# Patient Record
Sex: Female | Born: 1965 | Race: Asian | Hispanic: No | Marital: Married | Smoking: Never smoker
Health system: Southern US, Community
[De-identification: ages and names within clinical notes are randomized; demographics above are authoritative.]

## PROBLEM LIST (undated history)

## (undated) DIAGNOSIS — G5600 Carpal tunnel syndrome, unspecified upper limb: Secondary | ICD-10-CM

## (undated) DIAGNOSIS — N200 Calculus of kidney: Secondary | ICD-10-CM

## (undated) DIAGNOSIS — Z87442 Personal history of urinary calculi: Secondary | ICD-10-CM

## (undated) DIAGNOSIS — S62109A Fracture of unspecified carpal bone, unspecified wrist, initial encounter for closed fracture: Secondary | ICD-10-CM

## (undated) DIAGNOSIS — R740 Nonspecific elevation of levels of transaminase and lactic acid dehydrogenase [LDH]: Secondary | ICD-10-CM

## (undated) DIAGNOSIS — D219 Benign neoplasm of connective and other soft tissue, unspecified: Secondary | ICD-10-CM

## (undated) DIAGNOSIS — E049 Nontoxic goiter, unspecified: Secondary | ICD-10-CM

## (undated) DIAGNOSIS — E041 Nontoxic single thyroid nodule: Secondary | ICD-10-CM

## (undated) DIAGNOSIS — N63 Unspecified lump in unspecified breast: Secondary | ICD-10-CM

## (undated) HISTORY — DX: Carpal tunnel syndrome, unspecified upper limb: G56.00

## (undated) HISTORY — DX: Calculus of kidney: N20.0

## (undated) HISTORY — DX: Fracture of unspecified carpal bone, unspecified wrist, initial encounter for closed fracture: S62.109A

## (undated) HISTORY — DX: Unspecified lump in unspecified breast: N63.0

## (undated) HISTORY — DX: Nonspecific elevation of levels of transaminase and lactic acid dehydrogenase (ldh): R74.0

## (undated) HISTORY — DX: Benign neoplasm of connective and other soft tissue, unspecified: D21.9

## (undated) HISTORY — PX: TUBAL LIGATION: SHX77

## (undated) HISTORY — DX: Nontoxic single thyroid nodule: E04.1

## (undated) HISTORY — DX: Nontoxic goiter, unspecified: E04.9

## (undated) HISTORY — DX: Personal history of urinary calculi: Z87.442

---

## 1999-09-11 ENCOUNTER — Emergency Department (HOSPITAL_COMMUNITY): Admission: EM | Admit: 1999-09-11 | Discharge: 1999-09-11 | Payer: Self-pay | Admitting: Emergency Medicine

## 1999-09-11 ENCOUNTER — Encounter: Payer: Self-pay | Admitting: Emergency Medicine

## 1999-09-11 ENCOUNTER — Ambulatory Visit (HOSPITAL_COMMUNITY): Admission: RE | Admit: 1999-09-11 | Discharge: 1999-09-11 | Payer: Self-pay | Admitting: Emergency Medicine

## 2003-11-06 ENCOUNTER — Other Ambulatory Visit: Admission: RE | Admit: 2003-11-06 | Discharge: 2003-11-06 | Payer: Self-pay | Admitting: Obstetrics and Gynecology

## 2003-11-07 ENCOUNTER — Encounter: Admission: RE | Admit: 2003-11-07 | Discharge: 2003-11-07 | Payer: Self-pay | Admitting: Obstetrics and Gynecology

## 2004-11-05 ENCOUNTER — Other Ambulatory Visit: Admission: RE | Admit: 2004-11-05 | Discharge: 2004-11-05 | Payer: Self-pay | Admitting: Obstetrics and Gynecology

## 2005-11-06 ENCOUNTER — Other Ambulatory Visit: Admission: RE | Admit: 2005-11-06 | Discharge: 2005-11-06 | Payer: Self-pay | Admitting: Obstetrics and Gynecology

## 2006-09-28 ENCOUNTER — Encounter: Admission: RE | Admit: 2006-09-28 | Discharge: 2006-09-28 | Payer: Self-pay | Admitting: Obstetrics and Gynecology

## 2008-11-26 LAB — CONVERTED CEMR LAB: Pap Smear: NORMAL

## 2008-12-28 ENCOUNTER — Encounter: Admission: RE | Admit: 2008-12-28 | Discharge: 2008-12-28 | Payer: Self-pay | Admitting: Obstetrics and Gynecology

## 2009-06-27 ENCOUNTER — Ambulatory Visit: Payer: Self-pay | Admitting: Internal Medicine

## 2009-06-27 DIAGNOSIS — Z87442 Personal history of urinary calculi: Secondary | ICD-10-CM

## 2009-06-27 HISTORY — DX: Personal history of urinary calculi: Z87.442

## 2009-06-27 LAB — CONVERTED CEMR LAB
ALT: 39 units/L — ABNORMAL HIGH (ref 0–35)
AST: 30 units/L (ref 0–37)
Albumin: 4.1 g/dL (ref 3.5–5.2)
Alkaline Phosphatase: 69 units/L (ref 39–117)
BUN: 10 mg/dL (ref 6–23)
Basophils Absolute: 0.1 10*3/uL (ref 0.0–0.1)
Basophils Relative: 1.4 % (ref 0.0–3.0)
Bilirubin, Direct: 0.1 mg/dL (ref 0.0–0.3)
CO2: 30 meq/L (ref 19–32)
Calcium: 8.7 mg/dL (ref 8.4–10.5)
Chloride: 102 meq/L (ref 96–112)
Creatinine, Ser: 0.6 mg/dL (ref 0.4–1.2)
Eosinophils Absolute: 0 10*3/uL (ref 0.0–0.7)
Eosinophils Relative: 0.5 % (ref 0.0–5.0)
Free T4: 0.7 ng/dL (ref 0.6–1.6)
GFR calc non Af Amer: 115.86 mL/min (ref >60)
Glucose, Bld: 91 mg/dL (ref 70–99)
HCT: 40.2 % (ref 36.0–46.0)
Hemoglobin: 13.4 g/dL (ref 12.0–15.0)
Lymphocytes Relative: 26.5 % (ref 12.0–46.0)
Lymphs Abs: 2.1 10*3/uL (ref 0.7–4.0)
MCHC: 33.2 g/dL (ref 30.0–36.0)
MCV: 87.8 fL (ref 78.0–100.0)
Monocytes Absolute: 0.5 10*3/uL (ref 0.1–1.0)
Monocytes Relative: 5.9 % (ref 3.0–12.0)
Neutro Abs: 5.1 10*3/uL (ref 1.4–7.7)
Neutrophils Relative %: 65.7 % (ref 43.0–77.0)
Platelets: 329 10*3/uL (ref 150.0–400.0)
Potassium: 3.8 meq/L (ref 3.5–5.1)
RBC: 4.58 M/uL (ref 3.87–5.11)
RDW: 11.7 % (ref 11.5–14.6)
Sed Rate: 21 mm/hr (ref 0–22)
Sodium: 138 meq/L (ref 135–145)
T3 Uptake Ratio: 34.3 % (ref 22.5–37.0)
T3, Free: 2.8 pg/mL (ref 2.3–4.2)
T4, Total: 6.7 ug/dL (ref 5.0–12.5)
TSH: 0.49 microintl units/mL (ref 0.35–5.50)
Total Bilirubin: 1.1 mg/dL (ref 0.3–1.2)
Total Protein: 7.4 g/dL (ref 6.0–8.3)
WBC: 7.8 10*3/uL (ref 4.5–10.5)

## 2009-07-01 ENCOUNTER — Ambulatory Visit (HOSPITAL_COMMUNITY): Admission: RE | Admit: 2009-07-01 | Discharge: 2009-07-01 | Payer: Self-pay | Admitting: Internal Medicine

## 2009-07-01 ENCOUNTER — Encounter: Payer: Self-pay | Admitting: Internal Medicine

## 2009-07-11 ENCOUNTER — Ambulatory Visit: Payer: Self-pay | Admitting: Internal Medicine

## 2009-07-11 DIAGNOSIS — R7401 Elevation of levels of liver transaminase levels: Secondary | ICD-10-CM

## 2009-07-11 DIAGNOSIS — R7402 Elevation of levels of lactic acid dehydrogenase (LDH): Secondary | ICD-10-CM

## 2009-07-11 HISTORY — DX: Elevation of levels of lactic acid dehydrogenase (LDH): R74.02

## 2009-07-11 HISTORY — DX: Elevation of levels of liver transaminase levels: R74.01

## 2009-07-11 LAB — CONVERTED CEMR LAB
ALT: 18 units/L (ref 0–35)
AST: 22 units/L (ref 0–37)
Albumin: 4.4 g/dL (ref 3.5–5.2)
Alkaline Phosphatase: 67 units/L (ref 39–117)
Bilirubin, Direct: 0.1 mg/dL (ref 0.0–0.3)
HCV Ab: NEGATIVE
Hep A Total Ab: POSITIVE — AB
Hep B Core Total Ab: POSITIVE — AB
Hep B S Ab: POSITIVE — AB
Hepatitis B Surface Ag: NEGATIVE
Total Bilirubin: 1.3 mg/dL — ABNORMAL HIGH (ref 0.3–1.2)
Total Protein: 8 g/dL (ref 6.0–8.3)

## 2009-08-01 ENCOUNTER — Encounter: Payer: Self-pay | Admitting: Endocrinology

## 2009-08-01 ENCOUNTER — Ambulatory Visit: Payer: Self-pay | Admitting: Endocrinology

## 2009-08-01 ENCOUNTER — Other Ambulatory Visit: Admission: RE | Admit: 2009-08-01 | Discharge: 2009-08-01 | Payer: Self-pay | Admitting: Endocrinology

## 2009-08-01 DIAGNOSIS — E041 Nontoxic single thyroid nodule: Secondary | ICD-10-CM

## 2009-08-01 HISTORY — DX: Nontoxic single thyroid nodule: E04.1

## 2009-12-30 ENCOUNTER — Encounter: Admission: RE | Admit: 2009-12-30 | Discharge: 2009-12-30 | Payer: Self-pay | Admitting: Obstetrics and Gynecology

## 2010-01-30 ENCOUNTER — Ambulatory Visit: Payer: Self-pay | Admitting: Endocrinology

## 2010-01-30 LAB — CONVERTED CEMR LAB
ALT: 31 units/L (ref 0–35)
AST: 26 units/L (ref 0–37)
Albumin: 4.3 g/dL (ref 3.5–5.2)
Alkaline Phosphatase: 62 units/L (ref 39–117)
Amylase: 120 units/L (ref 27–131)
Basophils Absolute: 0 10*3/uL (ref 0.0–0.1)
Basophils Relative: 0.6 % (ref 0.0–3.0)
Bilirubin Urine: NEGATIVE
Bilirubin, Direct: 0.1 mg/dL (ref 0.0–0.3)
Eosinophils Absolute: 0 10*3/uL (ref 0.0–0.7)
Eosinophils Relative: 0.7 % (ref 0.0–5.0)
HCT: 40.1 % (ref 36.0–46.0)
Hemoglobin: 13.9 g/dL (ref 12.0–15.0)
Ketones, ur: NEGATIVE mg/dL
Lymphocytes Relative: 31.8 % (ref 12.0–46.0)
Lymphs Abs: 2.2 10*3/uL (ref 0.7–4.0)
MCHC: 34.6 g/dL (ref 30.0–36.0)
MCV: 86.4 fL (ref 78.0–100.0)
Monocytes Absolute: 0.5 10*3/uL (ref 0.1–1.0)
Monocytes Relative: 6.9 % (ref 3.0–12.0)
Neutro Abs: 4.3 10*3/uL (ref 1.4–7.7)
Neutrophils Relative %: 60 % (ref 43.0–77.0)
Nitrite: NEGATIVE
Platelets: 290 10*3/uL (ref 150.0–400.0)
RBC: 4.64 M/uL (ref 3.87–5.11)
RDW: 11.9 % (ref 11.5–14.6)
Specific Gravity, Urine: 1.01 (ref 1.000–1.030)
TSH: 0.74 microintl units/mL (ref 0.35–5.50)
Total Bilirubin: 0.5 mg/dL (ref 0.3–1.2)
Total Protein, Urine: NEGATIVE mg/dL
Total Protein: 7.6 g/dL (ref 6.0–8.3)
Urine Glucose: NEGATIVE mg/dL
Urobilinogen, UA: 0.2 (ref 0.0–1.0)
WBC: 7 10*3/uL (ref 4.5–10.5)
pH: 7.5 (ref 5.0–8.0)

## 2010-01-31 ENCOUNTER — Ambulatory Visit: Payer: Self-pay | Admitting: Endocrinology

## 2010-01-31 LAB — CONVERTED CEMR LAB
Bilirubin Urine: NEGATIVE
Ketones, ur: NEGATIVE mg/dL
Leukocytes, UA: NEGATIVE
Nitrite: NEGATIVE
Specific Gravity, Urine: <=1.005 (ref 1.000–1.030)
Total Protein, Urine: NEGATIVE mg/dL
Urine Glucose: NEGATIVE mg/dL
Urobilinogen, UA: 0.2 (ref 0.0–1.0)
pH: 7 (ref 5.0–8.0)

## 2010-02-07 ENCOUNTER — Ambulatory Visit (HOSPITAL_COMMUNITY): Admission: RE | Admit: 2010-02-07 | Discharge: 2010-02-07 | Payer: Self-pay | Admitting: Endocrinology

## 2010-06-27 ENCOUNTER — Ambulatory Visit (HOSPITAL_COMMUNITY): Admission: RE | Admit: 2010-06-27 | Discharge: 2010-06-27 | Payer: Self-pay | Admitting: Family Medicine

## 2010-12-07 ENCOUNTER — Encounter: Payer: Self-pay | Admitting: Endocrinology

## 2010-12-11 IMAGING — US US SOFT TISSUE HEAD/NECK
1 series · 7 of 7 positions shown · non-contrast
Comparison: 07/01/2009

CLINICAL DATA: Left thyroid nodule, follow-up

THYROID ULTRASOUND
TECHNIQUE: Ultrasound examination of the thyroid gland and
adjacent soft tissues was performed.

[Series 1: us soft tissue head/neck · 0.07mm/px · 7 of 7 slices shown]
[im 1/7]
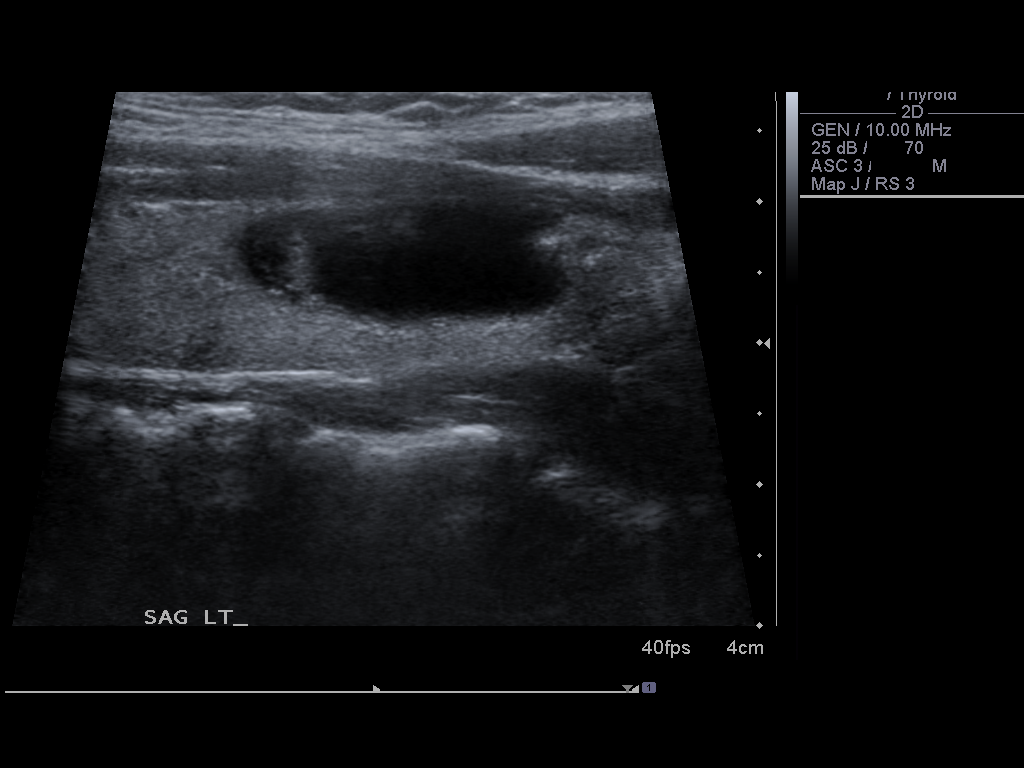
[im 2/7]
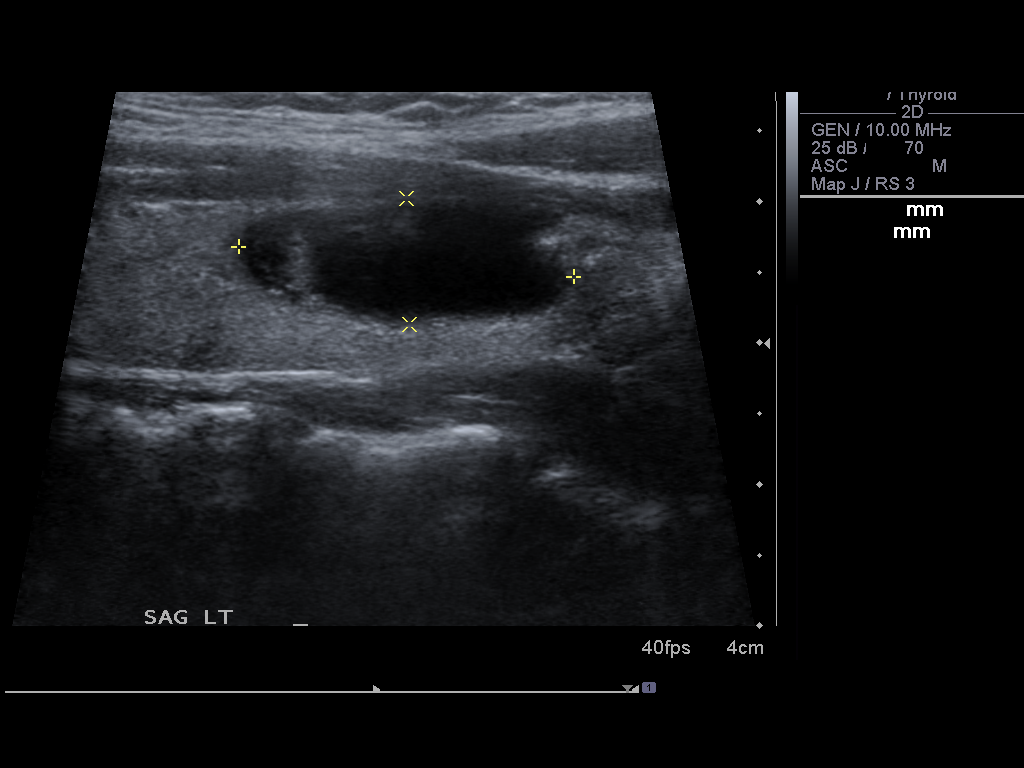
[im 3/7]
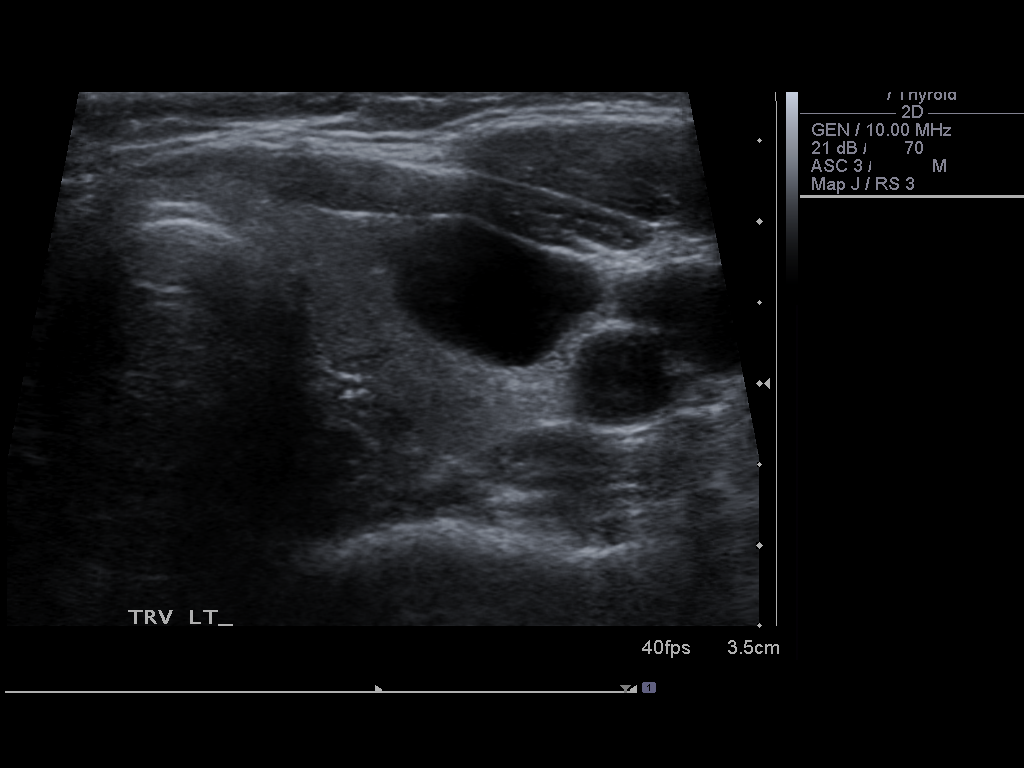
[im 4/7]
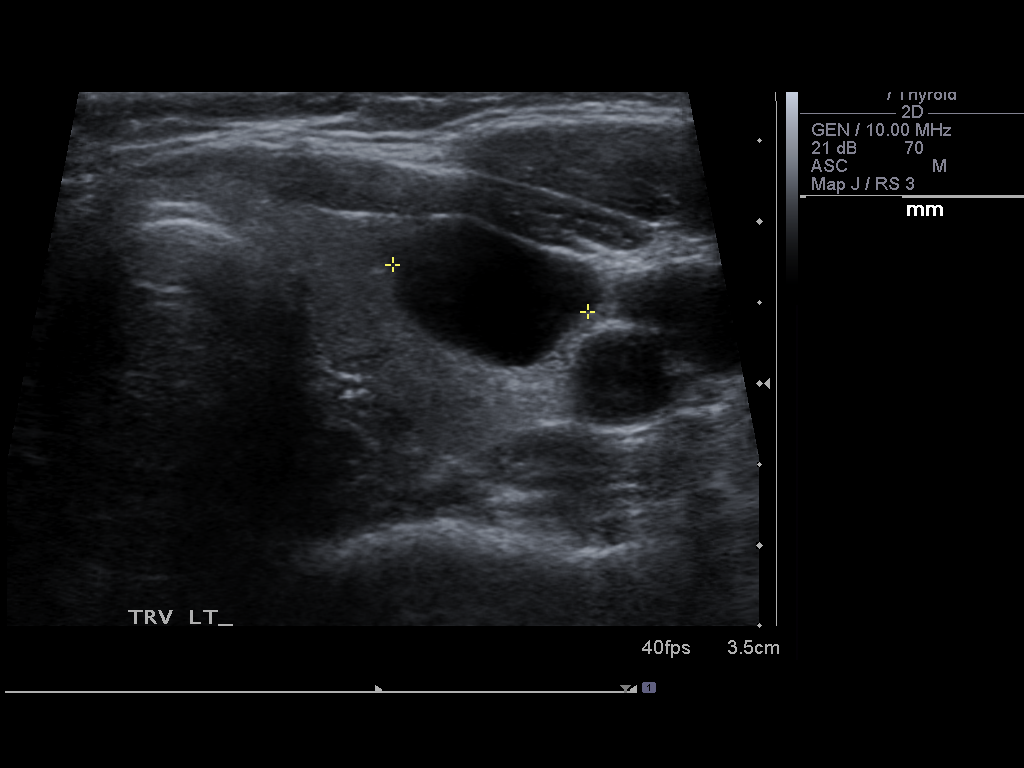
[im 5/7]
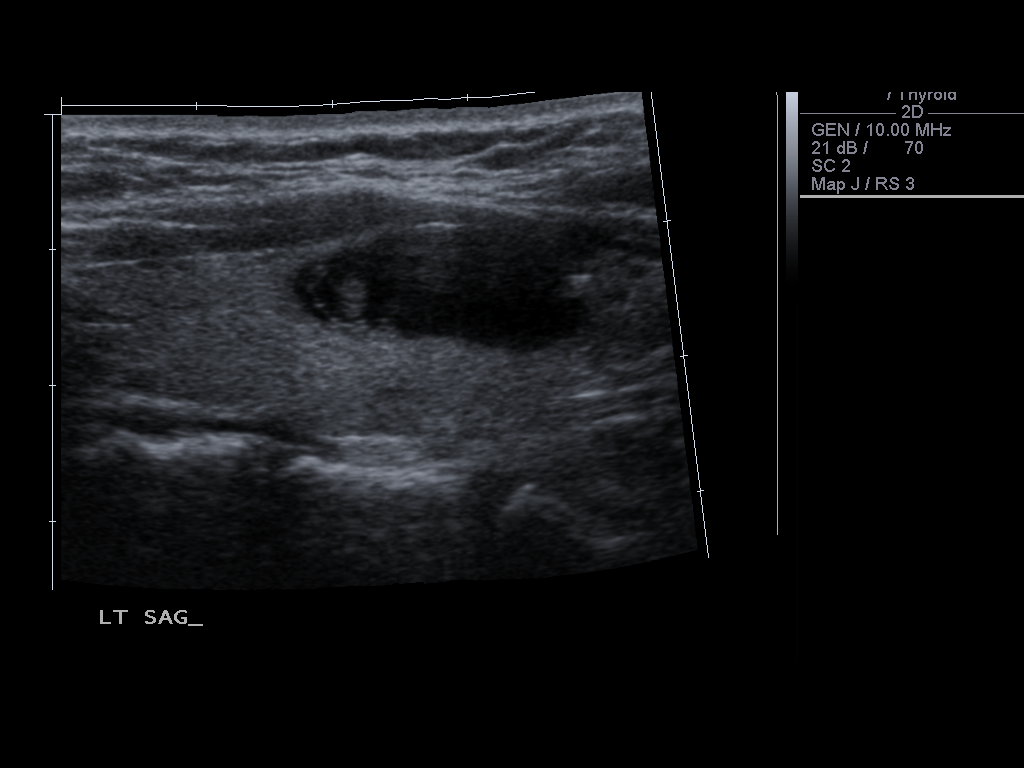
[im 6/7]
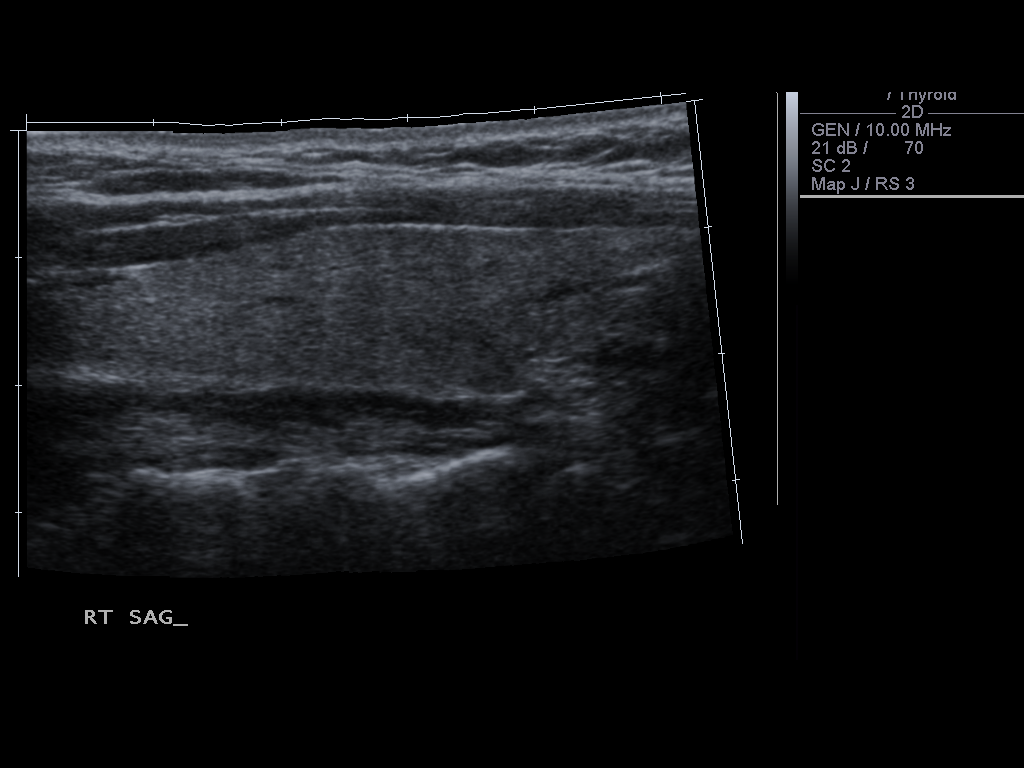
[im 7/7]
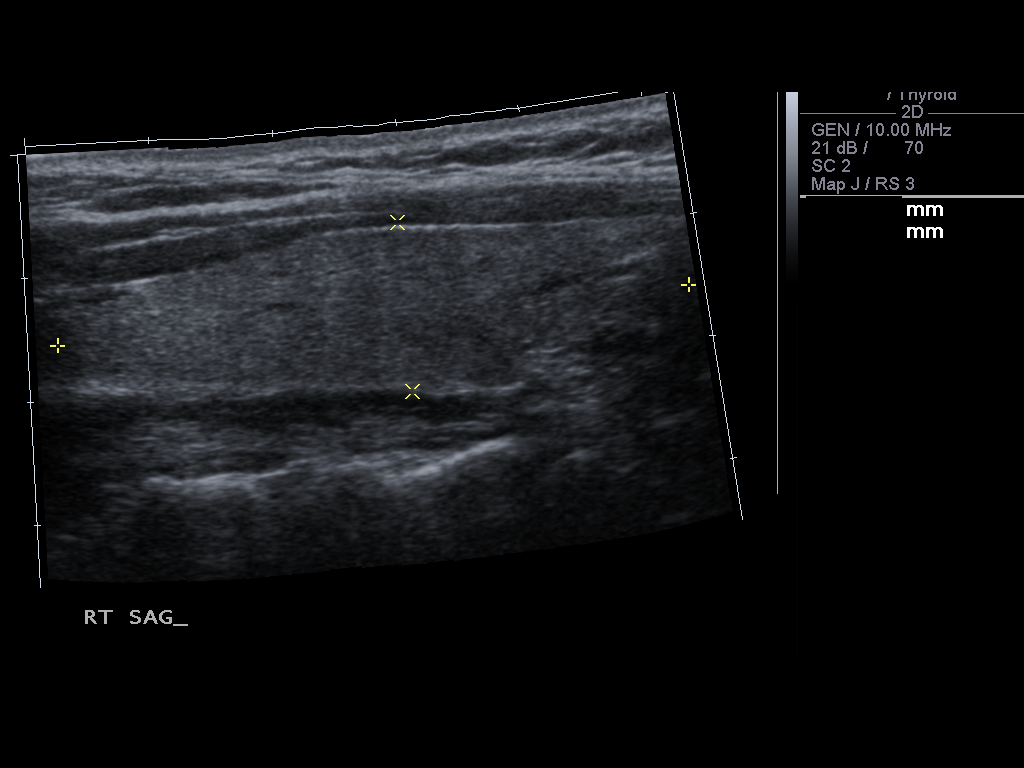

[7 of 7 positions shown; findings below may reference images not displayed]

FINDINGS: Right thyroid lobe 4.7 cm length by 1.5 cm AP by 1.6 cm transverse.
Left thyroid lobe 4.2 cm length by 1.5 cm AP by 1.8 cm transverse.
Thyroid isthmus 3 mm thick.
Homogeneous thyroid echogenicity throughout both lobes.
Large complex predominantly cystic mass/nodule identified in left
thyroid lobe, at midportion anteriorly, 2.4 cm length by 0.9 cm AP
by 1.4 cm transverse.
Mass again demonstrates internal mural nodular areas at upper and
lower portions.
On axial images, the more superior mural nodule appears slightly
increased in size.
Cystic component appears decreased in size since the prior study.
No new thyroid masses, calcifications or cysts.
No regional adenopathy.
IMPRESSION: Complex cystic lesion containing internal mural nodularity at the
mid to inferior left thyroid lobe, with the soft tissue at the
upper pole region questionably more prominent on axial images
versus differences in sonographic sectioning.
Lesion contains complex features and is indeterminate in character,
and malignancy is not excluded.
The lack of significant overall increase in size of the lesion
since June 2009 does not exclude malignancy.
Consider tissue diagnosis/biopsy of the solid components of this
complex cystic lesion; if the biopsy is not performed, then serial
follow-up exams would be required to demonstrate at least 2 years
of overall stability.

## 2010-12-18 NOTE — Assessment & Plan Note (Signed)
Summary: NEW ENDO-[facility]-PER FLAG/MP & DD-$50-THYROMEGALY-STC   Vital Signs:  Patient profile:   45 year old female Menstrual status:  regular Height:      61 inches Weight:      131 pounds BMI:     24.84 O2 Sat:      99 % on Room air Temp:     98.4 degrees F oral Pulse rate:   82 / minute BP sitting:   128 / 78  (left arm) Cuff size:   regular  Vitals Entered By: Bill Salinas CMA (August 01, 2009 10:08 AM)  O2 Flow:  Room air CC: New endo consult for goiter/ ab   Referring Provider:  Dr Sanda Linger Primary Provider:  Etta Grandchild MD  CC:  New endo consult for goiter/ ab.  History of Present Illness: pt noted a lump at the left anterior neck x a few mos.  she feels it has since gotten smaller.  she had slight associated pain, but that has resolved.  Current Medications (verified): 1)  None  Allergies (verified): No Known Drug Allergies  Past History:  Past Medical History: Last updated: 06/27/2009 Uterine fibroids Nephrolithiasis, hx of  Family History: Reviewed history from 06/27/2009 and no changes required. Family History Diabetes 1st degree relative Family History Thyroid disease- mother (deceased) with goiter   Social History: Reviewed history from 06/27/2009 and no changes required. Married Never Smoked Alcohol use-no Drug use-no Regular exercise-yes does not work outside the home  Review of Systems       The patient complains of weight gain.         denies headache, double vision, sob, diarrhea, polyuria, myalgias, tremor, anxiety, hypoglycemia, bruising, rhinorrhea.  headache and hoarseness have resolved, but she still has palpitations.  she reports "hot flashes."  she reports intermittent, positional, numbness of the right arm.  she reports rhinorrhea.  Physical Exam  General:  normal appearance.   Head:  head: no deformity eyes: no periorbital swelling, no proptosis external nose and ears are normal mouth: no lesion seen  Neck:   there is an approx 2 cm left thyroid nodule Lungs:  Clear to auscultation bilaterally. Normal respiratory effort.  Heart:  Regular rate and rhythm without murmurs or gallops noted. Normal S1,S2.   Msk:  muscle bulk and strength are grossly normal.  no obvious joint swelling.  gait is normal and steady  Extremities:  no deformity Neurologic:  cn 2-12 grossly intact.   readily moves all 4's.    Skin:  normal texture and temp.  no rash.  not diaphoretic  Cervical Nodes:  No significant adenopathy.  Psych:  Alert and cooperative; normal mood and affect; normal attention span and concentration.   Additional Exam:  test results are reviewed:  THYROID ULTRASOUND Complex largely cystic nodule in the left lobe of 3.0 x 1.4 x 1.8 cm.  FastTSH                   0.49 uIU/mL    procedure: thyroid needle bx: consent obtained, signed form on chart local: xylocaine 2% prep: betadine 3 bxs are done with 25 and 27g needles no complications  cytology: THYROID, LEFT, FINE NEEDLE ASPIRATION, THIN PREP, SMEARS AND CELL BLOCK:  FINDINGS CONSISTENT WITH A CYSTIC COLLOID LESION.    Impression & Recommendations:  Problem # 1:  THYROID NODULE, LEFT (ICD-241.0) benign on bx  Problem # 2:  hoarseness resolved.  excedingly unlikely to be thyroid-related  Problem # 3:  palpitations  not thyroid-related  Other Orders: Consultation Level IV (16109) Thyroid Biopsy Percutaneous Core Needle (60100)  Patient Instructions: 1)  tests are being ordered for you today.  a few days after the test(s), please call [phone] to hear your test results. 2)  (update: i left message on phone-tree:  ret 4-6 months)

## 2010-12-18 NOTE — Assessment & Plan Note (Signed)
Summary: 2 WK ROV Natale Milch $50   Vital Signs:  Patient profile:   45 year old female Menstrual status:  regular Height:      61 inches Weight:      132 pounds BMI:     25.03 O2 Sat:      98 % on Room air Temp:     98.0 degrees F oral Pulse rate:   78 / minute Pulse rhythm:   regular BP sitting:   122 / 70  (left arm) Cuff size:   regular  Vitals Entered By: Rock Nephew CMA (July 11, 2009 9:53 AM)  Nutrition Counseling: Patient's BMI is greater than 25 and therefore counseled on weight management options.  O2 Flow:  Room air CC: follow-up visit Is Patient Diabetic? No   Primary Care Provider:  Etta Grandchild MD  CC:  follow-up visit.  History of Present Illness: She returns for f/up and says the goiter occasionally feels uncomfortable.  Preventive Screening-Counseling & Management  Alcohol-Tobacco     Alcohol drinks/day: 0  Hep-HIV-STD-Contraception     Hepatitis Risk: risk noted     HIV Risk: no risk noted     STD Risk: no risk noted      Sexual History:  currently monogamous.        Drug Use:  never.        Blood Transfusions:  no.    Clinical Review Panels:  Diabetes Management   Creatinine:  0.6 (06/27/2009)  CBC   WBC:  7.8 (06/27/2009)   RBC:  4.58 (06/27/2009)   Hgb:  13.4 (06/27/2009)   Hct:  40.2 (06/27/2009)   Platelets:  329.0 (06/27/2009)   MCV  87.8 (06/27/2009)   MCHC  33.2 (06/27/2009)   RDW  11.7 (06/27/2009)   PMN:  65.7 (06/27/2009)   Lymphs:  26.5 (06/27/2009)   Monos:  5.9 (06/27/2009)   Eosinophils:  0.5 (06/27/2009)   Basophil:  1.4 (06/27/2009)  Complete Metabolic Panel   Glucose:  91 (06/27/2009)   Sodium:  138 (06/27/2009)   Potassium:  3.8 (06/27/2009)   Chloride:  102 (06/27/2009)   CO2:  30 (06/27/2009)   BUN:  10 (06/27/2009)   Creatinine:  0.6 (06/27/2009)   Albumin:  4.1 (06/27/2009)   Total Protein:  7.4 (06/27/2009)   Calcium:  8.7 (06/27/2009)   Total Bili:  1.1 (06/27/2009)   Alk Phos:  69  (06/27/2009)   SGPT (ALT):  39 (06/27/2009)   SGOT (AST):  30 (06/27/2009)   Current Medications (verified): 1)  None  Allergies (verified): No Known Drug Allergies  Past History:  Past Medical History: Reviewed history from 06/27/2009 and no changes required. Uterine fibroids Nephrolithiasis, hx of  Past Surgical History: Reviewed history from 06/27/2009 and no changes required. Tubal ligation  Family History: Reviewed history from 06/27/2009 and no changes required. Family History Diabetes 1st degree relative Family History Thyroid disease- mother with goiter  Social History: Reviewed history from 06/27/2009 and no changes required. Married Never Smoked Alcohol use-no Drug use-no Regular exercise-yes Hepatitis Risk:  risk noted Drug Use:  never  Review of Systems  The patient denies anorexia, fever, weight loss, and abdominal pain.   GI:  Denies abdominal pain, change in bowel habits, loss of appetite, nausea, vomiting, and yellowish skin color. Endo:  Denies cold intolerance, excessive hunger, excessive thirst, excessive urination, heat intolerance, polyuria, and weight change.  Physical Exam  General:  alert, well-developed, well-nourished, well-hydrated, appropriate dress, normal appearance, healthy-appearing, cooperative to examination,  and good hygiene.   Eyes:  no icterus Mouth:  Oral mucosa and oropharynx without lesions or exudates.  Teeth in good repair. Neck:  thyroid tender and large left  thryroid goiter.  normal carotid upstroke, no carotid bruits, no cervical lymphadenopathy, thyroid tender, and thryroid nodule(s).   Lungs:  Normal respiratory effort, chest expands symmetrically. Lungs are clear to auscultation, no crackles or wheezes. Heart:  Normal rate and regular rhythm. S1 and S2 normal without gallop, murmur, click, rub or other extra sounds. Abdomen:  Bowel sounds positive,abdomen soft and non-tender without masses, organomegaly or hernias  noted. Msk:  No deformity or scoliosis noted of thoracic or lumbar spine.   Pulses:  R and L carotid,radial,femoral,dorsalis pedis and posterior tibial pulses are full and equal bilaterally Extremities:  No clubbing, cyanosis, edema, or deformity noted with normal full range of motion of all joints.   Neurologic:  No cranial nerve deficits noted. Station and gait are normal. Plantar reflexes are down-going bilaterally. DTRs are symmetrical throughout. Sensory, motor and coordinative functions appear intact. Skin:  Intact without suspicious lesions or rashes Psych:  Cognition and judgment appear intact. Alert and cooperative with normal attention span and concentration. No apparent delusions, illusions, hallucinations   Impression & Recommendations:  Problem # 1:  TRANSAMINASES, SERUM, ELEVATED (ICD-790.4) Assessment New this looks like fatty liver disease but need to look for viral causes in this [place] female. Orders: T-Hepatitis A Antibody (16109-60454) T-Hepatitis B Core Antibody [phone]) T-Hepatitis B DNA, Quant (29562-13086) T-Hepatitis B Surface Antibody (57846-96295) T-Hepatitis B Surface Antigen (28413-24401) T-Hepatitis C Antibody (02725-36644) Venipuncture (03474) TLB-Hepatic/Liver Function Pnl (80076-HEPATIC)  Problem # 2:  THYROMEGALY (ICD-240.9) Assessment: Deteriorated She wants to consider treatment options for this symptomatic goiter but she is euthyroid. Orders: Endocrinology Referral (Endocrine)  Patient Instructions: 1)  Please schedule a follow-up appointment in 1 month.

## 2010-12-18 NOTE — Assessment & Plan Note (Signed)
Summary: PER PT FU--#--STC   Vital Signs:  Patient profile:   45 year old female Menstrual status:  regular Height:      61 inches (154.94 cm) Weight:      132.38 pounds (60.17 kg) O2 Sat:      99 % on Room air Temp:     97.7 degrees F (36.50 degrees C) oral Pulse rate:   91 / minute BP sitting:   108 / 70  (left arm) Cuff size:   regular  Vitals Entered By: Josph Macho RMA (January 30, 2010 9:02 AM)  O2 Flow:  Room air CC: 6 month follow up/ CF   Referring Provider:  Dr Sanda Linger Primary Provider:  Etta Grandchild MD  CC:  6 month follow up/ CF.  History of Present Illness: pt states many years of intermittent moderate pain at the ruq of the abdomen, worse in the context of eating fatty foods.  no assoc n/v she does not notice the thyroid nodule.  Current Medications (verified): 1)  None  Allergies (verified): No Known Drug Allergies  Past History:  Past Medical History: Uterine fibroids ABDOMINAL PAIN, CHRONIC (ICD-789.00) THYROID NODULE, LEFT (ICD-241.0) TRANSAMINASES, SERUM, ELEVATED (ICD-790.4) FAMILY HISTORY DIABETES 1ST DEGREE RELATIVE (ICD-V18.0) NEPHROLITHIASIS, HX OF (ICD-V13.01)  Review of Systems  The patient denies weight loss and weight gain.    Physical Exam  General:  normal appearance.   Nose:  swollen nasal turbinates.   Neck:  there is a fullness on the left thyroid lobe, but i do not appreciate the nodule. Abdomen:  abdomen is soft, nontender.  no hepatosplenomegaly.   not distended.  no hernia  Additional Exam:  White Cell Count          7.0 K/uL                    4.5-10.5   Hemoglobin                13.9 g/dL                   91.4-78.2   Hematocrit                40.1 %                      36.0-46.0    Platelet Count            290.0 K/uL                  150.0-400.0   Amylase                   120 U/L                     27-131   FastTSH                   0.74 uIU/mL                 0.35-5.50   Total Bilirubin            0.5 mg/dL                   9.5-6.2   Direct Bilirubin          0.1 mg/dL                   1.3-0.8   Alkaline Phosphatase  62 U/L                      39-117   AST                       26 U/L                      0-37   ALT                       31 U/L                      0-35   Total Protein             7.6 g/dL                    1.1-9.1   Albumin                   4.3 g/dL                    4.7-8.2   Leukocyte Esterace        LARGE                       Negative   Nitrite                   NEGATIVE                    Negative   Urine WBC                 11-20/hpf                   0-2/hpf     Results faxed to site/floor on 01/30/2010 10:49 AM by Luellen Pucker.   Urine RBC                 11-20/hpf                   0-2/hpf   Urine Epith               Many(>10/hpf)               Rare(0-4/hpf)   Urine Bacteria            Few(10-50/hpf)         Impression & Recommendations:  Problem # 1:  ABDOMINAL PAIN, CHRONIC (ICD-789.00) recurrent  Problem # 2:  uti ? related to #1  Problem # 3:  THYROID NODULE, LEFT (ICD-241.0) ? change  Medications Added to Medication List This Visit: 1)  Ciprofloxacin Hcl 500 Mg Tabs (Ciprofloxacin hcl) .Marland Kitchen.. 1 two times a day  Other Orders: TLB-CBC Platelet - w/Differential (85025-CBCD) TLB-Amylase (82150-AMYL) TLB-TSH (Thyroid Stimulating Hormone) (84443-TSH) TLB-Hepatic/Liver Function Pnl (80076-HEPATIC) TLB-Udip w/ Micro (81001-URINE) Radiology Referral (Radiology) Radiology Referral (Radiology) Est. Patient Level IV (95621)  Patient Instructions: 1)  ultrasound of the abdomen and thyroid.  you will be called with a day and time for an appointment 2)  bood tests today. 3)  tests are being ordered for you today.  a few days after the test(s), please call [phone] to hear your test results. 4)  return 1 year 5)  given your nodular thyroid disease ("lumpy thyroid"), you should expect the slow development of hyperthyroidism (overactive  thyroid) 6)  (update:  i called pt 01/30/10.  come in for urine c/s.  call us with pharmacy name, so i can send rx for cipro). Prescriptions: CIPROFLOXACIN HCL 500 MG TABS (CIPROFLOXACIN HCL) 1 two times a day  #14 x 0   Entered and Authorized by:   Minus Breeding MD   Signed by:   Minus Breeding MD on 01/31/2010   Method used:   Electronically to        [facility]  [phone]* (retail)       [address]       [place], [place]  [address]       Ph: [phone] or [phone]       Fax: [phone]   RxID:   [id number]

## 2010-12-18 NOTE — Letter (Signed)
Summary: Results Follow-up Letter  [facility]  [address] [place], [place] [address]   Phone: [phone]  Fax: [phone]    07/01/2009  [address] [place], [place]  [address]  Dear Ms. Mohammad,   The following are the results of your recent test(s):  Test       Result     Thyroid scan     left goiter on the left Thyroid blood test   normal CBC         normal Liver         one, mildly elevated enzyme Kidney       normal   _________________________________________________________  Please call for an appointment in 2-3 weeks _________________________________________________________ _________________________________________________________ _________________________________________________________  Sincerely,  Sanda Linger MD [facility]

## 2010-12-18 NOTE — Assessment & Plan Note (Signed)
Summary: NEW / Clent Ridges Natale Milch   Vital Signs:  Patient profile:   45 year old female Menstrual status:  regular LMP:     06/12/2009 Height:      61 inches Weight:      132.25 pounds BMI:     25.08 O2 Sat:      97 % on Room air Temp:     98.3 degrees F oral Pulse rate:   96 / minute Pulse rhythm:   regular Resp:     16 per minute BP sitting:   122 / 72  (left arm) Cuff size:   large  Vitals Entered By: Rock Nephew CMA (June 27, 2009 1:11 PM)  Nutrition Counseling: Patient's BMI is greater than 25 and therefore counseled on weight management options.  O2 Flow:  Room air CC: New to establish, Preventive Care LMP (date): 06/12/2009  years   days  Menstrual Status regular Enter LMP: 06/12/2009 Last PAP Result Normal   Primary Care Provider:  Etta Grandchild MD  CC:  New to establish and Preventive Care.  History of Present Illness: New to me c/o left anterior neck swelling with some discomfort for one week. She has also had mild palpitations and a recent sore throat.  Preventive Screening-Counseling & Management  Alcohol-Tobacco     Alcohol drinks/day: 0     Smoking Status: never  Caffeine-Diet-Exercise     Does Patient Exercise: yes  Hep-HIV-STD-Contraception     Hepatitis Risk: no risk noted     HIV Risk: no risk noted     STD Risk: no risk noted     SBE monthly: yes     SBE Education/Counseling: to perform regular SBE  Safety-Violence-Falls     Seat Belt Use: yes     Helmet Use: yes     Firearms in the Home: no firearms in the home     Smoke Detectors: yes     Violence in the Home: no risk noted     Sexual Abuse: no      Sexual History:  currently monogamous.        Drug Use:  no.        Blood Transfusions:  no.    Current Medications (verified): 1)  None  Allergies (verified): No Known Drug Allergies  Past History:  Past Medical History: Uterine fibroids Nephrolithiasis, hx of  Past Surgical History: Tubal ligation  Family  History: Family History Diabetes 1st degree relative Family History Thyroid disease- mother with goiter  Social History: Married Never Smoked Alcohol use-no Drug use-no Regular exercise-yes Smoking Status:  never Hepatitis Risk:  no risk noted HIV Risk:  no risk noted STD Risk:  no risk noted Seat Belt Use:  yes Sexual History:  currently monogamous Blood Transfusions:  no Drug Use:  no Does Patient Exercise:  yes  Review of Systems  The patient denies anorexia, fever, weight loss, weight gain, chest pain, prolonged cough, headaches, hemoptysis, abdominal pain, hematuria, suspicious skin lesions, depression, unusual weight change, abnormal bleeding, and enlarged lymph nodes.   CV:  Complains of palpitations; denies chest pain or discomfort, fainting, lightheadness, and near fainting. Endo:  Denies cold intolerance, excessive hunger, excessive thirst, excessive urination, heat intolerance, polyuria, and weight change.  Physical Exam  General:  alert, well-developed, well-nourished, well-hydrated, appropriate dress, normal appearance, healthy-appearing, cooperative to examination, and good hygiene.   Head:  normocephalic and atraumatic.   Eyes:  vision grossly intact, pupils equal, and pupils round.   Mouth:  Oral mucosa and oropharynx without lesions or exudates.  Teeth in good repair. Neck:  thyroid tender and large left  thryroid nodule/goiter.  normal carotid upstroke, no carotid bruits, no cervical lymphadenopathy, thyroid tender, and thryroid nodule(s).   Lungs:  Normal respiratory effort, chest expands symmetrically. Lungs are clear to auscultation, no crackles or wheezes. Heart:  Normal rate and regular rhythm. S1 and S2 normal without gallop, murmur, click, rub or other extra sounds. Abdomen:  Bowel sounds positive,abdomen soft and non-tender without masses, organomegaly or hernias noted. Msk:  No deformity or scoliosis noted of thoracic or lumbar spine.   Pulses:  R and L  carotid,radial,femoral,dorsalis pedis and posterior tibial pulses are full and equal bilaterally Extremities:  No clubbing, cyanosis, edema, or deformity noted with normal full range of motion of all joints.   Neurologic:  No cranial nerve deficits noted. Station and gait are normal. Plantar reflexes are down-going bilaterally. DTRs are symmetrical throughout. Sensory, motor and coordinative functions appear intact. Skin:  Intact without suspicious lesions or rashes Cervical Nodes:  No lymphadenopathy noted Psych:  Cognition and judgment appear intact. Alert and cooperative with normal attention span and concentration. No apparent delusions, illusions, hallucinations   Impression & Recommendations:  Problem # 1:  THYROMEGALY (ICD-240.9) Assessment New  Orders: Venipuncture (03474) TLB-BMP (Basic Metabolic Panel-BMET) (80048-METABOL) TLB-CBC Platelet - w/Differential (85025-CBCD) TLB-Hepatic/Liver Function Pnl (80076-HEPATIC) TLB-TSH (Thyroid Stimulating Hormone) (84443-TSH) TLB-Sedimentation Rate (ESR) (85652-ESR) TLB-T3, Free (Triiodothyronine) (84481-T3FREE) TLB-T3 Uptake (84479-T3UP) TLB-T4 (Thyrox), Free [id number]) TLB-T4 (Thyrox), Total [phone]) T- * Misc. Laboratory test [phone]) Radiology Referral (Radiology)  Problem # 2:  NEPHROLITHIASIS, HX OF (ICD-V13.01) Assessment: Improved  PAP Screening:    Hx Cervical Dysplasia in last 5 yrs? No    3 normal PAP smears in last 5 yrs? Yes    Last PAP smear:  11/26/2008  PAP Smear Results:    Date of Exam:  11/26/2008    Results:  Normal  Osteoporosis Risk Assessment:  Risk Factors for Fracture or Low Bone Density:   Race (White or Asian):     yes   Smoking status:       never  Patient Instructions: 1)  Please schedule a follow-up appointment in 2 weeks.

## 2010-12-18 NOTE — Miscellaneous (Signed)
Summary: LT Thyroid biopsy/[facility]  LT Thyroid biopsy/ [facility]   Imported By: Sherian Rein 08/02/2009 12:21:36  _____________________________________________________________________  External Attachment:    Type:   Image     Comment:   External Document

## 2011-10-12 ENCOUNTER — Other Ambulatory Visit: Payer: Self-pay | Admitting: Obstetrics and Gynecology

## 2011-10-12 DIAGNOSIS — Z1231 Encounter for screening mammogram for malignant neoplasm of breast: Secondary | ICD-10-CM

## 2011-10-21 ENCOUNTER — Ambulatory Visit
Admission: RE | Admit: 2011-10-21 | Discharge: 2011-10-21 | Disposition: A | Discharge status: Home / Self Care | Payer: 59 | Source: Ambulatory Visit | Attending: Obstetrics and Gynecology | Admitting: Obstetrics and Gynecology

## 2011-10-21 DIAGNOSIS — Z1231 Encounter for screening mammogram for malignant neoplasm of breast: Secondary | ICD-10-CM

## 2011-12-29 ENCOUNTER — Ambulatory Visit (INDEPENDENT_AMBULATORY_CARE_PROVIDER_SITE_OTHER): Payer: 59 | Admitting: Endocrinology

## 2011-12-29 ENCOUNTER — Encounter: Payer: Self-pay | Admitting: Endocrinology

## 2011-12-29 ENCOUNTER — Other Ambulatory Visit (INDEPENDENT_AMBULATORY_CARE_PROVIDER_SITE_OTHER): Payer: 59

## 2011-12-29 ENCOUNTER — Other Ambulatory Visit (HOSPITAL_COMMUNITY)
Admission: RE | Admit: 2011-12-29 | Discharge: 2011-12-29 | Disposition: A | Discharge status: Home / Self Care | Payer: 59 | Source: Ambulatory Visit | Attending: Endocrinology | Admitting: Endocrinology

## 2011-12-29 VITALS — BP 142/86 | HR 81 | Temp 98.2°F | Ht 61.0 in | Wt 130.0 lb

## 2011-12-29 DIAGNOSIS — E041 Nontoxic single thyroid nodule: Secondary | ICD-10-CM | POA: Insufficient documentation

## 2011-12-29 DIAGNOSIS — E042 Nontoxic multinodular goiter: Secondary | ICD-10-CM | POA: Insufficient documentation

## 2011-12-29 LAB — TSH: TSH: 0.85 u[IU]/mL (ref 0.35–5.50)

## 2011-12-29 NOTE — Progress Notes (Signed)
  Subjective:    Patient ID: Julie Rosales, female    DOB: [birth year], 46 y.o.   MRN: [id number]  HPI Pt returns for f/u of multinodular goiter (2010), with a dominant complex nodule on the left.  bx of this in 2010 was benign.  She can notice the nodule, and it is enlarging. Past Medical History  Diagnosis Date  . THYROID NODULE, LEFT 08/01/2009  . NEPHROLITHIASIS, HX OF 06/27/2009  . TRANSAMINASES, SERUM, ELEVATED 07/11/2009  . Fibroids     uterine    Past Surgical History  Procedure Date  . Tubal ligation     History   Social History  . Marital Status: Married    Spouse Name: N/A    Number of Children: N/A  . Years of Education: N/A   Occupational History  . Does not work outside the home    Social History Main Topics  . Smoking status: Never Smoker   . Smokeless tobacco: Not on file  . Alcohol Use: No  . Drug Use: No  . Sexually Active: Not on file   Other Topics Concern  . Not on file   Social History Narrative   Regular exercise-yes    No current outpatient prescriptions on file prior to visit.    No Known Allergies  Family History  Problem Relation Age of Onset  . Thyroid disease Mother     goiter  . Diabetes Other     BP 142/86  Pulse 81  Temp(Src) 98.2 F (36.8 C) (Oral)  Ht 5\' 1"  (1.549 m)  Wt 130 lb (58.968 kg)  BMI 24.56 kg/m2  SpO2 98%  LMP 12/17/2011    Review of Systems Denies pain at the neck    Objective:   Physical Exam VITAL SIGNS:  See vs page GENERAL: no distress Neck:  2x3 cm left thyroid nodule   thyroid needle bx: consent obtained, signed form on chart local: xylocaine 2% prep: betadine 1 aspiration is done with 25g needle.  2 cc dark fluid is obtained no complications (i reviewed cytol result)     Assessment & Plan:  Complex thyroid nodule.  It is bigger, but mostly cystic HTN, prob situational

## 2011-12-29 NOTE — Patient Instructions (Addendum)
A thyroid blood test is being requested for you today.  please call [phone] to hear your test results.  You will be prompted to enter the 9-digit "MRN" number that appears at the top left of this page, followed by #.  Then you will hear the message. You can also call the same number, and hear about the results of the fluid analysis. Please return in 1 year. most of the time, a "lumpy thyroid" will eventually become overactive.  this is usually a slow process, happening over the span of many years. Let's recheck the ultrasound.  you will receive a phone call, about a day and time for an appointment.  You can retrieve the results as above.   (update: i left message on phone-tree:  cytol is benign.  rx as we discussed)

## 2011-12-31 ENCOUNTER — Encounter: Payer: Self-pay | Admitting: *Deleted

## 2011-12-31 NOTE — Telephone Encounter (Signed)
A user error has taken place: encounter opened in error, closed for administrative reasons.

## 2012-01-01 ENCOUNTER — Ambulatory Visit (HOSPITAL_COMMUNITY)
Admission: RE | Admit: 2012-01-01 | Discharge: 2012-01-01 | Disposition: A | Discharge status: Home / Self Care | Payer: 59 | Source: Ambulatory Visit | Attending: Endocrinology | Admitting: Endocrinology

## 2012-01-01 DIAGNOSIS — E042 Nontoxic multinodular goiter: Secondary | ICD-10-CM | POA: Insufficient documentation

## 2012-01-01 DIAGNOSIS — E041 Nontoxic single thyroid nodule: Secondary | ICD-10-CM | POA: Insufficient documentation

## 2013-01-05 ENCOUNTER — Other Ambulatory Visit (HOSPITAL_COMMUNITY)
Admission: RE | Admit: 2013-01-05 | Discharge: 2013-01-05 | Disposition: A | Discharge status: Home / Self Care | Payer: 59 | Source: Ambulatory Visit | Attending: Endocrinology | Admitting: Endocrinology

## 2013-01-05 ENCOUNTER — Ambulatory Visit (INDEPENDENT_AMBULATORY_CARE_PROVIDER_SITE_OTHER): Payer: 59 | Admitting: Endocrinology

## 2013-01-05 VITALS — BP 126/74 | HR 78 | Temp 97.8°F | Wt 153.0 lb

## 2013-01-05 DIAGNOSIS — E049 Nontoxic goiter, unspecified: Secondary | ICD-10-CM | POA: Insufficient documentation

## 2013-01-05 DIAGNOSIS — E042 Nontoxic multinodular goiter: Secondary | ICD-10-CM

## 2013-01-05 DIAGNOSIS — E039 Hypothyroidism, unspecified: Secondary | ICD-10-CM

## 2013-01-05 LAB — TSH: TSH: 0.57 u[IU]/mL (ref 0.35–5.50)

## 2013-01-05 NOTE — Progress Notes (Signed)
  Subjective:    Patient ID: Julie Rosales, female    DOB: 1966-07-05, 47 y.o.   MRN: [id number]  HPI Pt returns for f/u of multinodular goiter (dx'ed 2010; bx of the dominant nodule (left lobe) in 2010, was benign; she had aspiration of 30 cc of cystic fluid from this in 2013).  She can notice the nodule, and it is enlarging.   Past Medical History  Diagnosis Date  . THYROID NODULE, LEFT 08/01/2009  . NEPHROLITHIASIS, HX OF 06/27/2009  . TRANSAMINASES, SERUM, ELEVATED 07/11/2009  . Fibroids     uterine    Past Surgical History  Procedure Laterality Date  . Tubal ligation      History   Social History  . Marital Status: Married    Spouse Name: N/A    Number of Children: N/A  . Years of Education: N/A   Occupational History  . Does not work outside the home    Social History Main Topics  . Smoking status: Never Smoker   . Smokeless tobacco: Never Used  . Alcohol Use: No  . Drug Use: No  . Sexually Active: Yes   Other Topics Concern  . Not on file   Social History Narrative   Regular exercise-yes          No current outpatient prescriptions on file prior to visit.   No current facility-administered medications on file prior to visit.    No Known Allergies  Family History  Problem Relation Age of Onset  . Thyroid disease Mother     goiter  . Diabetes Other     BP 126/74  Pulse 78  Temp(Src) 97.8 F (36.6 C) (Oral)  Wt 153 lb (69.4 kg)  BMI 28.92 kg/m2  SpO2 97%  Review of Systems Denies neck pain    Objective:   Physical Exam VITAL SIGNS:  See vs page GENERAL: no distress Neck: at the left anterior neck, there is a 4 cm thyroid mass      Assessment & Plan:  Multinodular goter, with recurrent cyst    thyroid needle bx: consent obtained, signed form on chart The area is first sprayed with cooling local anesthetic agent local: xylocaine 2%, with epinephrine prep: alcohol pad with a 25g needle, 8 cc dark brown fluid is aspirated no  complications

## 2013-01-05 NOTE — Patient Instructions (Addendum)
A thyroid blood test is being requested for you today.  We'll contact you with results of this, and the biopsy. Please return in 1 year. most of the time, a "lumpy thyroid" will eventually become overactive.  this is usually a slow process, happening over the span of many years.

## 2013-01-06 ENCOUNTER — Telehealth: Payer: Self-pay | Admitting: Internal Medicine

## 2013-01-06 NOTE — Telephone Encounter (Signed)
Please call Gennine w/ Cytology lab about this patient. CB# [phone] / Roanna Raider

## 2013-01-10 ENCOUNTER — Encounter: Payer: Self-pay | Admitting: Obstetrics and Gynecology

## 2013-01-10 ENCOUNTER — Ambulatory Visit: Payer: 59 | Admitting: Obstetrics and Gynecology

## 2013-01-10 VITALS — BP 102/60 | Temp 98.9°F | Ht 61.5 in | Wt 135.0 lb

## 2013-01-10 DIAGNOSIS — Z01419 Encounter for gynecological examination (general) (routine) without abnormal findings: Secondary | ICD-10-CM

## 2013-01-10 DIAGNOSIS — Z124 Encounter for screening for malignant neoplasm of cervix: Secondary | ICD-10-CM

## 2013-01-10 NOTE — Progress Notes (Signed)
Regular Periods: yes Mammogram: yes  Monthly Breast Ex.: yes Exercise: no  Tetanus < 10 years: no Seatbelts: yes  NI. Bladder Functn.: yes Abuse at home: no  Daily BM's: yes Stressful Work: no  Healthy Diet: yes Sigmoid-Colonoscopy: NO  Calcium: yes Medical problems this year: NONE   LAST PAP:2/13  Contraception: BTL  Mammogram:  12/12  PCP: Dr. Yetta Barre   Endocrinologist:  Dr. Everardo All  PMH: no change  FMH: no change  Last Bone Scan: no  Pt is married

## 2013-01-10 NOTE — Progress Notes (Signed)
Subjective:    Julie Rosales is a 47 y.o. female, G3P3, who presents for an annual exam. The patient reports no problems. Saw Dr. Everardo All (endocrinologist 01/07/13 to have the left side of her thyroid goiter drained.  Menstrual cycle:   LMP: Patient's last menstrual period was 12/23/2012.             Review of Systems Pertinent items are noted in HPI. Denies pelvic pain, urinary tract symptoms, vaginitis symptoms, irregular bleeding, menopausal symptoms, change in bowel habits or rectal bleeding   Objective:    BP 102/60  Temp(Src) 98.9 F (37.2 C) (Oral)  Ht 5' 1.5" (1.562 m)  Wt 135 lb (61.236 kg)  BMI 25.1 kg/m2  LMP 12/23/2012    Wt Readings from Last 1 Encounters:  01/10/13 135 lb (61.236 kg)   Body mass index is 25.1 kg/(m^2). General Appearance: Alert, no acute distress HEENT: Grossly normal Neck / Thyroid: Supple, left lobe thyromegaly without tenderness or cervical adenopathy Lungs: Clear to auscultation bilaterally Back: No CVA tenderness Breast Exam: No masses or nodes.No dimpling, nipple retraction or discharge. Cardiovascular: Regular rate and rhythm.  Gastrointestinal: Soft, non-tender, no masses or organomegaly Pelvic Exam: EGBUS-wnl, vagina-normal rugae, cervix- without lesions or tenderness, uterus appears normal size shape and consistency, adnexae-no masses or tenderness Rectovaginal: no masses and normal sphincter tone Lymphatic Exam: Non-palpable nodes in neck, clavicular,  axillary, or inguinal regions  Skin: no rashes or abnormalities Extremities: no clubbing cyanosis or edema  Neurologic: grossly normal Psychiatric: Alert and oriented   Assessment:   Routine GYN Exam Left Thyroid Goiter (being followed by Dr. Everardo All)   Plan:    PAP sent  RTO 1 year or prn  POWELL,ELMIRAPA-C

## 2013-01-12 LAB — PAP IG W/ RFLX HPV ASCU

## 2013-09-21 ENCOUNTER — Other Ambulatory Visit: Payer: Self-pay

## 2013-11-16 HISTORY — PX: IRRIGATION AND DEBRIDEMENT SEBACEOUS CYST: SHX5255

## 2014-07-02 ENCOUNTER — Emergency Department (HOSPITAL_COMMUNITY)
Admission: EM | Admit: 2014-07-02 | Discharge: 2014-07-02 | Disposition: A | Discharge status: Home / Self Care | Payer: 59 | Attending: Emergency Medicine | Admitting: Emergency Medicine

## 2014-07-02 ENCOUNTER — Encounter (HOSPITAL_COMMUNITY): Payer: Self-pay | Admitting: Emergency Medicine

## 2014-07-02 DIAGNOSIS — L738 Other specified follicular disorders: Secondary | ICD-10-CM | POA: Diagnosis not present

## 2014-07-02 DIAGNOSIS — Z87442 Personal history of urinary calculi: Secondary | ICD-10-CM | POA: Insufficient documentation

## 2014-07-02 DIAGNOSIS — M549 Dorsalgia, unspecified: Secondary | ICD-10-CM | POA: Insufficient documentation

## 2014-07-02 DIAGNOSIS — Z8639 Personal history of other endocrine, nutritional and metabolic disease: Secondary | ICD-10-CM | POA: Diagnosis not present

## 2014-07-02 DIAGNOSIS — Z8781 Personal history of (healed) traumatic fracture: Secondary | ICD-10-CM | POA: Insufficient documentation

## 2014-07-02 DIAGNOSIS — Z8669 Personal history of other diseases of the nervous system and sense organs: Secondary | ICD-10-CM | POA: Diagnosis not present

## 2014-07-02 DIAGNOSIS — Z79899 Other long term (current) drug therapy: Secondary | ICD-10-CM | POA: Insufficient documentation

## 2014-07-02 DIAGNOSIS — Z792 Long term (current) use of antibiotics: Secondary | ICD-10-CM | POA: Diagnosis not present

## 2014-07-02 DIAGNOSIS — Z862 Personal history of diseases of the blood and blood-forming organs and certain disorders involving the immune mechanism: Secondary | ICD-10-CM | POA: Diagnosis not present

## 2014-07-02 MED ORDER — CLINDAMYCIN HCL 300 MG PO CAPS: 300.0000 mg | ORAL_CAPSULE | Freq: Four times a day (QID) | ORAL | Status: DC

## 2014-07-02 MED ORDER — HYDROCODONE-ACETAMINOPHEN 5-325 MG PO TABS: 1.0000 | ORAL_TABLET | Freq: Four times a day (QID) | ORAL | Status: DC | PRN

## 2014-07-02 NOTE — ED Notes (Signed)
Pt presents with abscess to mid of back x several weeks. Pt states now it is draining. No hx of abscess.

## 2014-07-02 NOTE — ED Provider Notes (Signed)
CSN: [id number]     Arrival date & time 07/02/14  0825 History   First MD Initiated Contact with Patient 07/02/14 0830     Chief Complaint  Patient presents with  . Abscess     (Consider location/radiation/quality/duration/timing/severity/associated sxs/prior Treatment) Patient is a 48 y.o. female presenting with abscess.  Abscess Associated symptoms: no fever, no nausea and no vomiting     Ms Pauli is a 48 year old woman with benign multinodular goiter presenting with draining back abscess. She says she has had a midline pimple on her back for several years but noticed the area becoming swollen and tender over the past two weeks. One week ago it began draining yellow pus. Her husband is a Marine scientist and has been irrigating it with normal saline. She otherwise has no complaints and no history of prior skin abscesses or infections.  Past Medical History  Diagnosis Date  . NEPHROLITHIASIS, HX OF 06/27/2009  . TRANSAMINASES, SERUM, ELEVATED 07/11/2009  . Fibroids     uterine  . THYROID NODULE, LEFT 08/01/2009  . Goiter     H/O  . Carpal bone fracture   . Renal stones   . Carpal tunnel syndrome     H/O  . Breast nodule   . Fibroid     SMALL   Past Surgical History  Procedure Laterality Date  . Tubal ligation     Family History  Problem Relation Age of Onset  . Thyroid disease Mother     goiter  . Diabetes Mother   . Cancer Father     LARYX  . Cancer Sister     CERVICAL  . Cancer Paternal Grandfather     THROAT   History  Substance Use Topics  . Smoking status: Never Smoker   . Smokeless tobacco: Never Used  . Alcohol Use: No   OB History   Grav Para Term Preterm Abortions TAB SAB Ect Mult Living   3 3        3      Review of Systems  Constitutional: Negative for fever, chills and diaphoresis.  Respiratory: Negative for shortness of breath.   Cardiovascular: Negative for chest pain and palpitations.  Gastrointestinal: Negative for nausea, vomiting and abdominal  pain.  Musculoskeletal: Positive for back pain.      Allergies  Review of patient's allergies indicates no known allergies.  Home Medications   Prior to Admission medications   Medication Sig Start Date End Date Taking? Authorizing Provider  ibuprofen (ADVIL,MOTRIN) 200 MG tablet Take 400 mg by mouth every 6 (six) hours as needed for moderate pain.   Yes Historical Provider, MD  Multiple Vitamin (MULTIVITAMIN WITH MINERALS) TABS tablet Take 1 tablet by mouth daily.   Yes Historical Provider, MD  clindamycin (CLEOCIN) 300 MG capsule Take 1 capsule (300 mg total) by mouth 4 (four) times daily. X 10 days 07/02/14   Kelby Aline, MD  HYDROcodone-acetaminophen (NORCO/VICODIN) 5-325 MG per tablet Take 1 tablet by mouth every 6 (six) hours as needed for moderate pain. 07/02/14   Kelby Aline, MD   BP 149/72  Pulse 90  Temp(Src) 98.2 F (36.8 C) (Oral)  Resp 18  SpO2 97%  LMP 06/29/2014 Physical Exam  Constitutional: She is oriented to person, place, and time. She appears well-developed and well-nourished. No distress.  Cardiovascular: Normal rate, regular rhythm, normal heart sounds and intact distal pulses.  Exam reveals no gallop and no friction rub.   No murmur heard. Pulmonary/Chest: Effort normal  and breath sounds normal. No respiratory distress.  Neurological: She is alert and oriented to person, place, and time.  Skin: She is not diaphoretic.  Midline back ~ 4 cm height x 3 cm width pink induration with raised ~1x1cm swelling that has dried pus on a pinpoint opening. No surrounding erythema or warmth    ED Course  Procedures (including critical care time) Labs Review Labs Reviewed - No data to display  Imaging Review No results found.   EKG Interpretation None      MDM   Final diagnoses:  Infected sebaceous gland    9:13AM: The patient likely has a skin abscess v sebaceous gland infection. We obtained consent and performed an incision and drainage without  complication. The area was cleaned with iodine and anesthetized with 1% lidocaine w epi. A 2 cm incision was made and thick white sebaceous gland debris was expressed. The wound was irrigated well with NS and packed. The patient will receive clindamycin 300 QID x 10 days and should follow-up with a general surgeon.    Kelby Aline, MD 07/02/14 [phone]

## 2014-07-02 NOTE — ED Provider Notes (Signed)
I saw and evaluated the patient, reviewed the resident's note and I agree with the findings and plan.   EKG Interpretation None       Patient seen and evaluated.  C/O STS i small area in mid thoracic region for over a year.  Enlarged in size over last 2 weeks, draining fluid for last 3 days.  I & D performed, assisted by Resident.  Findings:  Sebaceous cyst, ruptured capsule with purulent drainage.  Decorticated and then irrigated until clear effluent.  1/4" packing placed.  Husband (an ER Rn) to remove 1" of packing daily until removed in its entirety.  Recommend Gen Surg F/U to discuss formal removal of cyst with any recurrence.   Tanna Furry, MD 07/02/14 [phone]

## 2014-07-02 NOTE — Discharge Instructions (Signed)
You were seen in the ED today for a sebaceous gland infection. The infection was opened, drained, and washed. Please schedule follow-up within two weeks with the surgeon number provided. Please cut off about one inch of the packing and each day and then re-bandage. Please take the prescribed antibiotic 4 times a day for 10 days and the pain medication every six hours as needed. Please seek medical attention or return to the ED if you have new or worsening pain at the infection site, fever, chills, night sweats, or any other worrisome medical condition.

## 2014-07-03 NOTE — ED Provider Notes (Signed)
I saw and evaluated the patient, reviewed the resident's note and I agree with the findings and plan.   EKG Interpretation None        Tanna Furry, MD 07/03/14 [phone]

## 2014-07-16 ENCOUNTER — Ambulatory Visit (INDEPENDENT_AMBULATORY_CARE_PROVIDER_SITE_OTHER): Payer: 59 | Admitting: Family Medicine

## 2014-07-16 ENCOUNTER — Encounter: Payer: Self-pay | Admitting: Family Medicine

## 2014-07-16 VITALS — BP 108/72 | HR 93 | Temp 98.8°F | Ht 61.5 in | Wt 141.0 lb

## 2014-07-16 DIAGNOSIS — E042 Nontoxic multinodular goiter: Secondary | ICD-10-CM

## 2014-07-16 DIAGNOSIS — Z86018 Personal history of other benign neoplasm: Secondary | ICD-10-CM

## 2014-07-16 DIAGNOSIS — R945 Abnormal results of liver function studies: Secondary | ICD-10-CM

## 2014-07-16 DIAGNOSIS — Z7189 Other specified counseling: Secondary | ICD-10-CM

## 2014-07-16 DIAGNOSIS — R7989 Other specified abnormal findings of blood chemistry: Secondary | ICD-10-CM

## 2014-07-16 DIAGNOSIS — Z8742 Personal history of other diseases of the female genital tract: Secondary | ICD-10-CM

## 2014-07-16 DIAGNOSIS — Z23 Encounter for immunization: Secondary | ICD-10-CM

## 2014-07-16 DIAGNOSIS — Z7689 Persons encountering health services in other specified circumstances: Secondary | ICD-10-CM

## 2014-07-16 LAB — LIPID PANEL
Cholesterol: 182 mg/dL (ref 0–200)
HDL: 55.8 mg/dL (ref >39.00)
LDL Cholesterol: 104 mg/dL — ABNORMAL HIGH (ref 0–99)
NonHDL: 126.2
Total CHOL/HDL Ratio: 3
Triglycerides: 110 mg/dL (ref 0.0–149.0)
VLDL: 22 mg/dL (ref 0.0–40.0)

## 2014-07-16 LAB — HEPATIC FUNCTION PANEL
ALT: 46 U/L — ABNORMAL HIGH (ref 0–35)
AST: 27 U/L (ref 0–37)
Albumin: 4.2 g/dL (ref 3.5–5.2)
Alkaline Phosphatase: 83 U/L (ref 39–117)
Bilirubin, Direct: 0 mg/dL (ref 0.0–0.3)
Total Bilirubin: 1.2 mg/dL (ref 0.2–1.2)
Total Protein: 7.5 g/dL (ref 6.0–8.3)

## 2014-07-16 LAB — HEMOGLOBIN A1C: Hgb A1c MFr Bld: 5.6 % (ref 4.6–6.5)

## 2014-07-16 NOTE — Progress Notes (Signed)
Pre visit review using our clinic review tool, if applicable. No additional management support is needed unless otherwise documented below in the visit note. 

## 2014-07-16 NOTE — Patient Instructions (Signed)
-  schedule appointment with endocrinologist for follow up  -schedule mammogram  -follow up yearly for physical exam  -We have ordered labs or studies at this visit. It can take up to 1-2 weeks for results and processing. We will contact you with instructions IF your results are abnormal. Normal results will be released to your [facility]. If you have not heard from [place] or can not find your results in [facility] in 2 weeks please contact our office.  -PLEASE SIGN UP FOR MYCHART TODAY   We recommend the following healthy lifestyle measures: - eat a healthy diet consisting of lots of vegetables, fruits, beans, nuts, seeds, healthy meats such as white chicken and fish and whole grains.  - avoid fried foods, fast food, processed foods, sodas, red meet and other fattening foods.  - get a least 150 minutes of aerobic exercise per week.   Follow up in: yearly and as needed

## 2014-07-16 NOTE — Progress Notes (Signed)
No chief complaint on file.   HPI:  Julie Rosales is here to establish care.  Last PCP and physical: sees Dr. Florene Glen for routine gyn care per her report. Has physical with gyn scheduled.  Has the following chronic problems and concerns today:  Patient Active Problem List   Diagnosis Date Noted  . History of uterine fibroid - seeing gyn 07/16/2014  . Multinodular goiter 12/29/2011    Multinodular goiter: -seeing endo for this,s/p biopsy showing cyst, follows yearly -denies: CP, palpitations, constipation   ROS negative for unless reported above: fevers, unintentional weight loss, hearing or vision loss, chest pain, palpitations, struggling to breath, hemoptysis, melena, hematochezia, hematuria, falls, loc, si, thoughts of self harm  Past Medical History  Diagnosis Date  . NEPHROLITHIASIS, HX OF 06/27/2009  . TRANSAMINASES, SERUM, ELEVATED 07/11/2009  . Fibroids     uterine  . THYROID NODULE, LEFT 08/01/2009  . Goiter     H/O  . Carpal bone fracture   . Renal stones   . Carpal tunnel syndrome     H/O  . Breast nodule   . Fibroid     SMALL    Family History  Problem Relation Age of Onset  . Thyroid disease Mother     goiter  . Diabetes Mother   . Cancer Father     LARYX  . Cancer Sister     CERVICAL  . Cancer Paternal Grandfather     THROAT    History   Social History  . Marital Status: Married    Spouse Name: N/A    Number of Children: N/A  . Years of Education: N/A   Occupational History  . Does not work outside the home    Social History Main Topics  . Smoking status: Never Smoker   . Smokeless tobacco: Never Used  . Alcohol Use: No  . Drug Use: No  . Sexual Activity: Yes    Birth Control/ Protection: Surgical     Comment: BTL   Other Topics Concern  . None   Social History Narrative   Work or School: homemaker      Home Situation: lives with husband and three children - all in college      Spiritual Beliefs: Christian      Lifestyle: no regular exercise; philipeno diet - lots of rice                   Current outpatient prescriptions:Multiple Vitamin (MULTIVITAMIN WITH MINERALS) TABS tablet, Take 1 tablet by mouth daily., Disp: , Rfl:   EXAM:  Filed Vitals:   07/16/14 1407  BP: 108/72  Pulse: 93  Temp: 98.8 F (37.1 C)    Body mass index is 26.21 kg/(m^2).  GENERAL: vitals reviewed and listed above, alert, oriented, appears well hydrated and in no acute distress  HEENT: atraumatic, conjunttiva clear, no obvious abnormalities on inspection of external nose and ears  NECK: no obvious masses on inspection  LUNGS: clear to auscultation bilaterally, no wheezes, rales or rhonchi, good air movement  CV: HRRR, no peripheral edema  MS: moves all extremities without noticeable abnormality  PSYCH: pleasant and cooperative, no obvious depression or anxiety  ASSESSMENT AND PLAN:  Discussed the following assessment and plan:  History of uterine fibroid - seeing gyn  Multinodular goiter - Plan: TSH  Encounter to establish care - Plan: Lipid panel, Hepatic Function Panel, Hemoglobin A1C  Abnormal LFTs, hx of - Plan: Hepatic Function Panel  -We reviewed the PMH,  PSH, FH, SH, Meds and Allergies. -We provided refills for any medications we will prescribe as needed. -We addressed current concerns per orders and patient instructions. -We have asked for records for pertinent exams, studies, vaccines and notes from previous providers. -We have advised patient to follow up per instructions below. -she wants to check cholesterol today - FASTING, labs per orders as not done in sometime  -Patient advised to return or notify a doctor immediately if symptoms worsen or persist or new concerns arise.  Patient Instructions  -schedule appointment with endocrinologist for follow up  -schedule mammogram  -follow up yearly for physical exam  -We have ordered labs or studies at this visit. It can take up to  1-2 weeks for results and processing. We will contact you with instructions IF your results are abnormal. Normal results will be released to your [facility]. If you have not heard from [place] or can not find your results in [facility] in 2 weeks please contact our office.  -PLEASE SIGN UP FOR MYCHART TODAY   We recommend the following healthy lifestyle measures: - eat a healthy diet consisting of lots of vegetables, fruits, beans, nuts, seeds, healthy meats such as white chicken and fish and whole grains.  - avoid fried foods, fast food, processed foods, sodas, red meet and other fattening foods.  - get a least 150 minutes of aerobic exercise per week.   Follow up in: yearly and as needed      KIM, [facility] R.

## 2014-07-17 ENCOUNTER — Other Ambulatory Visit: Payer: Self-pay | Admitting: *Deleted

## 2014-07-17 DIAGNOSIS — R945 Abnormal results of liver function studies: Principal | ICD-10-CM

## 2014-07-17 DIAGNOSIS — R7989 Other specified abnormal findings of blood chemistry: Secondary | ICD-10-CM

## 2014-07-17 LAB — TSH: TSH: 0.27 u[IU]/mL — ABNORMAL LOW (ref 0.35–4.50)

## 2014-07-20 ENCOUNTER — Encounter (INDEPENDENT_AMBULATORY_CARE_PROVIDER_SITE_OTHER): Payer: Commercial Managed Care - PPO | Admitting: General Surgery

## 2014-08-15 ENCOUNTER — Other Ambulatory Visit (INDEPENDENT_AMBULATORY_CARE_PROVIDER_SITE_OTHER): Payer: 59

## 2014-08-15 DIAGNOSIS — R945 Abnormal results of liver function studies: Principal | ICD-10-CM

## 2014-08-15 DIAGNOSIS — R7989 Other specified abnormal findings of blood chemistry: Secondary | ICD-10-CM

## 2014-08-15 LAB — HEPATIC FUNCTION PANEL
ALT: 69 U/L — ABNORMAL HIGH (ref 0–35)
AST: 59 U/L — ABNORMAL HIGH (ref 0–37)
Albumin: 4.1 g/dL (ref 3.5–5.2)
Alkaline Phosphatase: 77 U/L (ref 39–117)
Bilirubin, Direct: 0.1 mg/dL (ref 0.0–0.3)
Total Bilirubin: 0.9 mg/dL (ref 0.2–1.2)
Total Protein: 7.6 g/dL (ref 6.0–8.3)

## 2014-08-16 ENCOUNTER — Other Ambulatory Visit: Payer: Self-pay

## 2014-08-16 ENCOUNTER — Other Ambulatory Visit: Payer: Self-pay | Admitting: *Deleted

## 2014-08-16 DIAGNOSIS — Z1231 Encounter for screening mammogram for malignant neoplasm of breast: Secondary | ICD-10-CM

## 2014-08-16 DIAGNOSIS — R945 Abnormal results of liver function studies: Principal | ICD-10-CM

## 2014-08-16 DIAGNOSIS — R7989 Other specified abnormal findings of blood chemistry: Secondary | ICD-10-CM

## 2014-08-17 ENCOUNTER — Encounter: Payer: Self-pay | Admitting: Endocrinology

## 2014-08-17 ENCOUNTER — Ambulatory Visit (INDEPENDENT_AMBULATORY_CARE_PROVIDER_SITE_OTHER): Payer: 59 | Admitting: Endocrinology

## 2014-08-17 VITALS — BP 132/84 | HR 90 | Temp 98.5°F | Ht 61.5 in | Wt 139.0 lb

## 2014-08-17 DIAGNOSIS — E042 Nontoxic multinodular goiter: Secondary | ICD-10-CM

## 2014-08-17 NOTE — Patient Instructions (Signed)
Please consider the radioactive iodine pill.  It works like this.  We would check an ultrasound and a thyroid "scan" (a special, but easy and painless type of thyroid x ray).  It works like this: you go to the x-ray department of the hospital to swallow a pill, which contains a miniscule amount of radiation.  You will not notice any symptoms from this.  You will go back to the x-ray department the next day, to lie down in front of a camera.  The results of this will be sent to me.   Based on the results, i hope to order for you a treatment pill of radioactive iodine.  Although it is a larger amount of radiation, you will again notice no symptoms from this.  The pill is gone from your body in a few days (during which you should stay away from other people), but takes several months to work.  Therefore, please return here approximately 6-8 weeks after the treatment.  This treatment has been available for many years, and the only known side-effect is an underactive thyroid.  It is possible that i would eventually prescribe for you a thyroid hormone pill, which is very inexpensive.  You don't have to worry about side-effects of this thyroid hormone pill, because it is the same molecule your thyroid makes.

## 2014-08-17 NOTE — Progress Notes (Signed)
Subjective:    Patient ID: Julie Rosales, female    DOB: 1966/07/17, 48 y.o.   MRN: [id number]  HPI Pt returns for f/u of multinodular goiter (dx'ed 2010; bx of the dominant nodule (left lobe) in 2010, was benign; she had aspirations of large quantities of cystic fluid from this in 2013 and 2015).  She can notice the nodule, and it is again enlarging.  She has mild palpitations in the chest, and assoc weight gain Past Medical History  Diagnosis Date  . NEPHROLITHIASIS, HX OF 06/27/2009  . TRANSAMINASES, SERUM, ELEVATED 07/11/2009  . Fibroids     uterine  . THYROID NODULE, LEFT 08/01/2009  . Goiter     H/O  . Carpal bone fracture   . Renal stones   . Carpal tunnel syndrome     H/O  . Breast nodule   . Fibroid     SMALL    Past Surgical History  Procedure Laterality Date  . Tubal ligation    . Irrigation and debridement sebaceous cyst  2015    History   Social History  . Marital Status: Married    Spouse Name: N/A    Number of Children: N/A  . Years of Education: N/A   Occupational History  . Does not work outside the home    Social History Main Topics  . Smoking status: Never Smoker   . Smokeless tobacco: Never Used  . Alcohol Use: No  . Drug Use: No  . Sexual Activity: Yes    Birth Control/ Protection: Surgical     Comment: BTL   Other Topics Concern  . Not on file   Social History Narrative   Work or School: homemaker      Home Situation: lives with husband and three children - all in college      Spiritual Beliefs: Christian      Lifestyle: no regular exercise; philipeno diet - lots of rice                   Current Outpatient Prescriptions on File Prior to Visit  Medication Sig Dispense Refill  . Multiple Vitamin (MULTIVITAMIN WITH MINERALS) TABS tablet Take 1 tablet by mouth daily.       No current facility-administered medications on file prior to visit.    No Known Allergies  Family History  Problem Relation Age of Onset  .  Thyroid disease Mother     goiter  . Diabetes Mother   . Cancer Father     LARYX  . Cancer Sister     CERVICAL  . Cancer Paternal Grandfather     THROAT    BP 132/84  Pulse 90  Temp(Src) 98.5 F (36.9 C) (Oral)  Ht 5' 1.5" (1.562 m)  Wt 139 lb (63.05 kg)  BMI 25.84 kg/m2  SpO2 97%    Review of Systems Denies neck pain and persistent hoarseness    Objective:   Physical Exam VITAL SIGNS:  See vs page GENERAL: no distress Neck: at the left anterior neck, the thyroid mass is again noted: now approx 3 cm  Lab Results  Component Value Date   TSH 0.27* 07/16/2014   T4TOTAL 6.7 06/27/2009      Assessment & Plan:  Multinodular goiter, clinically unchanged Hyperthyroidism, new, due to the goiter   Patient is advised the following: Patient Instructions  Please consider the radioactive iodine pill.  It works like this.  We would check an ultrasound and a thyroid "scan" (  a special, but easy and painless type of thyroid x ray).  It works like this: you go to the x-ray department of the hospital to swallow a pill, which contains a miniscule amount of radiation.  You will not notice any symptoms from this.  You will go back to the x-ray department the next day, to lie down in front of a camera.  The results of this will be sent to me.   Based on the results, i hope to order for you a treatment pill of radioactive iodine.  Although it is a larger amount of radiation, you will again notice no symptoms from this.  The pill is gone from your body in a few days (during which you should stay away from other people), but takes several months to work.  Therefore, please return here approximately 6-8 weeks after the treatment.  This treatment has been available for many years, and the only known side-effect is an underactive thyroid.  It is possible that i would eventually prescribe for you a thyroid hormone pill, which is very inexpensive.  You don't have to worry about side-effects of this  thyroid hormone pill, because it is the same molecule your thyroid makes.

## 2014-08-23 ENCOUNTER — Ambulatory Visit
Admission: RE | Admit: 2014-08-23 | Discharge: 2014-08-23 | Disposition: A | Discharge status: Home / Self Care | Payer: 59 | Source: Ambulatory Visit

## 2014-08-23 DIAGNOSIS — Z1231 Encounter for screening mammogram for malignant neoplasm of breast: Secondary | ICD-10-CM

## 2014-08-27 ENCOUNTER — Other Ambulatory Visit: Payer: Self-pay | Admitting: Obstetrics and Gynecology

## 2014-08-27 DIAGNOSIS — R928 Other abnormal and inconclusive findings on diagnostic imaging of breast: Secondary | ICD-10-CM

## 2014-08-30 ENCOUNTER — Other Ambulatory Visit (INDEPENDENT_AMBULATORY_CARE_PROVIDER_SITE_OTHER): Payer: 59

## 2014-08-30 DIAGNOSIS — R7989 Other specified abnormal findings of blood chemistry: Secondary | ICD-10-CM

## 2014-08-30 DIAGNOSIS — R945 Abnormal results of liver function studies: Principal | ICD-10-CM

## 2014-08-30 LAB — HEPATIC FUNCTION PANEL
ALT: 54 U/L — ABNORMAL HIGH (ref 0–35)
AST: 35 U/L (ref 0–37)
Albumin: 3.6 g/dL (ref 3.5–5.2)
Alkaline Phosphatase: 77 U/L (ref 39–117)
Bilirubin, Direct: 0 mg/dL (ref 0.0–0.3)
Total Bilirubin: 1 mg/dL (ref 0.2–1.2)
Total Protein: 7.6 g/dL (ref 6.0–8.3)

## 2014-09-17 ENCOUNTER — Encounter: Payer: Self-pay | Admitting: Endocrinology

## 2014-09-17 ENCOUNTER — Ambulatory Visit
Admission: RE | Admit: 2014-09-17 | Discharge: 2014-09-17 | Disposition: A | Discharge status: Home / Self Care | Payer: 59 | Source: Ambulatory Visit | Attending: Obstetrics and Gynecology | Admitting: Obstetrics and Gynecology

## 2014-09-17 DIAGNOSIS — R928 Other abnormal and inconclusive findings on diagnostic imaging of breast: Secondary | ICD-10-CM

## 2017-08-05 ENCOUNTER — Encounter: Payer: Self-pay | Admitting: Family Medicine

## 2017-08-31 ENCOUNTER — Other Ambulatory Visit: Payer: Self-pay | Admitting: Obstetrics and Gynecology

## 2017-08-31 DIAGNOSIS — Z1231 Encounter for screening mammogram for malignant neoplasm of breast: Secondary | ICD-10-CM

## 2017-09-07 ENCOUNTER — Ambulatory Visit
Admission: RE | Admit: 2017-09-07 | Discharge: 2017-09-07 | Disposition: A | Discharge status: Home / Self Care | Payer: 59 | Source: Ambulatory Visit | Attending: Obstetrics and Gynecology | Admitting: Obstetrics and Gynecology

## 2017-09-07 DIAGNOSIS — Z1231 Encounter for screening mammogram for malignant neoplasm of breast: Secondary | ICD-10-CM

## 2017-11-25 ENCOUNTER — Encounter: Payer: Self-pay | Admitting: Family Medicine

## 2018-02-21 DIAGNOSIS — H52223 Regular astigmatism, bilateral: Secondary | ICD-10-CM | POA: Diagnosis not present

## 2018-02-21 DIAGNOSIS — H524 Presbyopia: Secondary | ICD-10-CM | POA: Diagnosis not present

## 2018-02-21 DIAGNOSIS — H5213 Myopia, bilateral: Secondary | ICD-10-CM | POA: Diagnosis not present

## 2018-05-03 DIAGNOSIS — Z124 Encounter for screening for malignant neoplasm of cervix: Secondary | ICD-10-CM | POA: Diagnosis not present

## 2018-05-03 DIAGNOSIS — Z6826 Body mass index (BMI) 26.0-26.9, adult: Secondary | ICD-10-CM | POA: Diagnosis not present

## 2018-05-03 DIAGNOSIS — Z01419 Encounter for gynecological examination (general) (routine) without abnormal findings: Secondary | ICD-10-CM | POA: Diagnosis not present

## 2018-05-12 DIAGNOSIS — Z0000 Encounter for general adult medical examination without abnormal findings: Secondary | ICD-10-CM | POA: Diagnosis not present

## 2019-05-12 ENCOUNTER — Other Ambulatory Visit: Payer: Self-pay | Admitting: Obstetrics and Gynecology

## 2019-05-12 DIAGNOSIS — Z1231 Encounter for screening mammogram for malignant neoplasm of breast: Secondary | ICD-10-CM

## 2019-05-24 DIAGNOSIS — Z0000 Encounter for general adult medical examination without abnormal findings: Secondary | ICD-10-CM | POA: Diagnosis not present

## 2019-05-24 DIAGNOSIS — S39012A Strain of muscle, fascia and tendon of lower back, initial encounter: Secondary | ICD-10-CM | POA: Diagnosis not present

## 2019-06-27 ENCOUNTER — Ambulatory Visit
Admission: RE | Admit: 2019-06-27 | Discharge: 2019-06-27 | Disposition: A | Discharge status: Home / Self Care | Payer: PRIVATE HEALTH INSURANCE | Source: Ambulatory Visit | Attending: Obstetrics and Gynecology | Admitting: Obstetrics and Gynecology

## 2019-06-27 ENCOUNTER — Other Ambulatory Visit: Payer: Self-pay

## 2019-06-27 DIAGNOSIS — Z1231 Encounter for screening mammogram for malignant neoplasm of breast: Secondary | ICD-10-CM | POA: Diagnosis not present

## 2019-10-20 ENCOUNTER — Other Ambulatory Visit: Payer: Self-pay

## 2019-10-20 DIAGNOSIS — Z20822 Contact with and (suspected) exposure to covid-19: Secondary | ICD-10-CM

## 2019-10-23 LAB — NOVEL CORONAVIRUS, NAA: SARS-CoV-2, NAA: NOT DETECTED

## 2019-11-06 DIAGNOSIS — Z20828 Contact with and (suspected) exposure to other viral communicable diseases: Secondary | ICD-10-CM | POA: Diagnosis not present

## 2019-11-13 DIAGNOSIS — Z20828 Contact with and (suspected) exposure to other viral communicable diseases: Secondary | ICD-10-CM | POA: Diagnosis not present

## 2019-11-21 DIAGNOSIS — Z20828 Contact with and (suspected) exposure to other viral communicable diseases: Secondary | ICD-10-CM | POA: Diagnosis not present

## 2019-11-28 DIAGNOSIS — Z20828 Contact with and (suspected) exposure to other viral communicable diseases: Secondary | ICD-10-CM | POA: Diagnosis not present

## 2019-12-05 DIAGNOSIS — Z20828 Contact with and (suspected) exposure to other viral communicable diseases: Secondary | ICD-10-CM | POA: Diagnosis not present

## 2019-12-12 DIAGNOSIS — Z20828 Contact with and (suspected) exposure to other viral communicable diseases: Secondary | ICD-10-CM | POA: Diagnosis not present

## 2019-12-19 DIAGNOSIS — Z20828 Contact with and (suspected) exposure to other viral communicable diseases: Secondary | ICD-10-CM | POA: Diagnosis not present

## 2020-09-15 DIAGNOSIS — H52223 Regular astigmatism, bilateral: Secondary | ICD-10-CM | POA: Diagnosis not present

## 2020-09-20 DIAGNOSIS — Z23 Encounter for immunization: Secondary | ICD-10-CM | POA: Diagnosis not present

## 2020-09-20 DIAGNOSIS — Z1211 Encounter for screening for malignant neoplasm of colon: Secondary | ICD-10-CM | POA: Diagnosis not present

## 2020-09-20 DIAGNOSIS — Z0000 Encounter for general adult medical examination without abnormal findings: Secondary | ICD-10-CM | POA: Diagnosis not present

## 2020-09-20 DIAGNOSIS — Z1231 Encounter for screening mammogram for malignant neoplasm of breast: Secondary | ICD-10-CM | POA: Diagnosis not present

## 2020-09-21 ENCOUNTER — Other Ambulatory Visit (HOSPITAL_COMMUNITY): Payer: Self-pay | Admitting: Family Medicine

## 2021-02-06 ENCOUNTER — Other Ambulatory Visit (HOSPITAL_BASED_OUTPATIENT_CLINIC_OR_DEPARTMENT_OTHER): Payer: Self-pay

## 2021-04-07 ENCOUNTER — Other Ambulatory Visit (HOSPITAL_COMMUNITY): Payer: Self-pay

## 2021-04-07 MED FILL — Atorvastatin Calcium Tab 20 MG (Base Equivalent): ORAL | 30 days supply | Qty: 30 | Fill #0 | Status: AC

## 2021-07-15 DIAGNOSIS — Z6826 Body mass index (BMI) 26.0-26.9, adult: Secondary | ICD-10-CM | POA: Diagnosis not present

## 2021-07-15 DIAGNOSIS — M545 Low back pain, unspecified: Secondary | ICD-10-CM | POA: Diagnosis not present

## 2021-07-15 DIAGNOSIS — Z01419 Encounter for gynecological examination (general) (routine) without abnormal findings: Secondary | ICD-10-CM | POA: Diagnosis not present

## 2021-07-15 DIAGNOSIS — Z124 Encounter for screening for malignant neoplasm of cervix: Secondary | ICD-10-CM | POA: Diagnosis not present

## 2021-08-11 ENCOUNTER — Other Ambulatory Visit (HOSPITAL_COMMUNITY): Payer: Self-pay

## 2021-08-11 MED FILL — Atorvastatin Calcium Tab 20 MG (Base Equivalent): ORAL | 30 days supply | Qty: 30 | Fill #1 | Status: AC

## 2021-08-27 DIAGNOSIS — Z1231 Encounter for screening mammogram for malignant neoplasm of breast: Secondary | ICD-10-CM | POA: Diagnosis not present

## 2021-09-30 ENCOUNTER — Other Ambulatory Visit (HOSPITAL_COMMUNITY): Payer: Self-pay

## 2021-10-29 DIAGNOSIS — Z23 Encounter for immunization: Secondary | ICD-10-CM | POA: Diagnosis not present

## 2021-10-29 DIAGNOSIS — Z0000 Encounter for general adult medical examination without abnormal findings: Secondary | ICD-10-CM | POA: Diagnosis not present

## 2021-10-29 DIAGNOSIS — Z1211 Encounter for screening for malignant neoplasm of colon: Secondary | ICD-10-CM | POA: Diagnosis not present

## 2021-10-29 DIAGNOSIS — E0590 Thyrotoxicosis, unspecified without thyrotoxic crisis or storm: Secondary | ICD-10-CM | POA: Diagnosis not present

## 2021-10-29 DIAGNOSIS — E785 Hyperlipidemia, unspecified: Secondary | ICD-10-CM | POA: Diagnosis not present

## 2021-10-30 ENCOUNTER — Other Ambulatory Visit (HOSPITAL_COMMUNITY): Payer: Self-pay

## 2021-10-30 MED ORDER — ATORVASTATIN CALCIUM 20 MG PO TABS
ORAL_TABLET | ORAL | 5 refills | Status: AC
  Filled 2021-10-30: qty 90, 90d supply, fill #0
  Filled 2022-04-20: qty 90, 90d supply, fill #1

## 2022-04-20 ENCOUNTER — Other Ambulatory Visit (HOSPITAL_COMMUNITY): Payer: Self-pay

## 2022-07-23 DIAGNOSIS — Z1211 Encounter for screening for malignant neoplasm of colon: Secondary | ICD-10-CM | POA: Diagnosis not present

## 2022-07-23 DIAGNOSIS — Z1239 Encounter for other screening for malignant neoplasm of breast: Secondary | ICD-10-CM | POA: Diagnosis not present

## 2022-07-23 DIAGNOSIS — Z124 Encounter for screening for malignant neoplasm of cervix: Secondary | ICD-10-CM | POA: Diagnosis not present

## 2022-07-23 DIAGNOSIS — Z6826 Body mass index (BMI) 26.0-26.9, adult: Secondary | ICD-10-CM | POA: Diagnosis not present

## 2022-07-23 DIAGNOSIS — N950 Postmenopausal bleeding: Secondary | ICD-10-CM | POA: Diagnosis not present

## 2022-07-23 DIAGNOSIS — Z01419 Encounter for gynecological examination (general) (routine) without abnormal findings: Secondary | ICD-10-CM | POA: Diagnosis not present

## 2022-08-06 DIAGNOSIS — N950 Postmenopausal bleeding: Secondary | ICD-10-CM | POA: Diagnosis not present

## 2022-08-06 DIAGNOSIS — D259 Leiomyoma of uterus, unspecified: Secondary | ICD-10-CM | POA: Diagnosis not present

## 2022-09-23 DIAGNOSIS — R92323 Mammographic fibroglandular density, bilateral breasts: Secondary | ICD-10-CM | POA: Diagnosis not present

## 2022-09-23 DIAGNOSIS — Z1231 Encounter for screening mammogram for malignant neoplasm of breast: Secondary | ICD-10-CM | POA: Diagnosis not present

## 2022-09-24 DIAGNOSIS — Z1211 Encounter for screening for malignant neoplasm of colon: Secondary | ICD-10-CM | POA: Diagnosis not present

## 2022-09-24 DIAGNOSIS — Z23 Encounter for immunization: Secondary | ICD-10-CM | POA: Diagnosis not present

## 2022-09-24 DIAGNOSIS — T148XXA Other injury of unspecified body region, initial encounter: Secondary | ICD-10-CM | POA: Diagnosis not present

## 2022-09-24 DIAGNOSIS — Z0000 Encounter for general adult medical examination without abnormal findings: Secondary | ICD-10-CM | POA: Diagnosis not present

## 2022-09-24 DIAGNOSIS — E785 Hyperlipidemia, unspecified: Secondary | ICD-10-CM | POA: Diagnosis not present

## 2023-05-14 DIAGNOSIS — H52223 Regular astigmatism, bilateral: Secondary | ICD-10-CM | POA: Diagnosis not present

## 2023-07-05 ENCOUNTER — Other Ambulatory Visit (HOSPITAL_COMMUNITY): Payer: Self-pay

## 2023-07-05 MED ORDER — AMOXICILLIN 875 MG PO TABS
875.0000 mg | ORAL_TABLET | Freq: Two times a day (BID) | ORAL | 0 refills | Status: AC
  Filled 2023-07-05 (×2): qty 14, 7d supply, fill #0

## 2023-07-06 ENCOUNTER — Other Ambulatory Visit (HOSPITAL_COMMUNITY): Payer: Self-pay

## 2023-07-06 ENCOUNTER — Other Ambulatory Visit: Payer: Self-pay

## 2023-07-06 ENCOUNTER — Encounter: Payer: Self-pay | Admitting: Pharmacist

## 2023-07-07 ENCOUNTER — Other Ambulatory Visit (HOSPITAL_COMMUNITY): Payer: Self-pay

## 2023-07-22 ENCOUNTER — Other Ambulatory Visit (HOSPITAL_COMMUNITY): Payer: Self-pay

## 2023-07-28 DIAGNOSIS — Z1331 Encounter for screening for depression: Secondary | ICD-10-CM | POA: Diagnosis not present

## 2023-07-28 DIAGNOSIS — Z01419 Encounter for gynecological examination (general) (routine) without abnormal findings: Secondary | ICD-10-CM | POA: Diagnosis not present

## 2023-07-28 DIAGNOSIS — Z1211 Encounter for screening for malignant neoplasm of colon: Secondary | ICD-10-CM | POA: Diagnosis not present

## 2023-07-28 DIAGNOSIS — Z139 Encounter for screening, unspecified: Secondary | ICD-10-CM | POA: Diagnosis not present

## 2023-07-28 DIAGNOSIS — Z124 Encounter for screening for malignant neoplasm of cervix: Secondary | ICD-10-CM | POA: Diagnosis not present

## 2023-07-28 DIAGNOSIS — Z1239 Encounter for other screening for malignant neoplasm of breast: Secondary | ICD-10-CM | POA: Diagnosis not present

## 2024-05-05 ENCOUNTER — Other Ambulatory Visit (HOSPITAL_COMMUNITY): Payer: Self-pay

## 2024-05-05 ENCOUNTER — Other Ambulatory Visit (HOSPITAL_BASED_OUTPATIENT_CLINIC_OR_DEPARTMENT_OTHER): Payer: Self-pay

## 2024-05-05 MED ORDER — HYDROCODONE-ACETAMINOPHEN 7.5-325 MG PO TABS
1.0000 | ORAL_TABLET | Freq: Four times a day (QID) | ORAL | 0 refills | Status: AC | PRN
  Filled 2024-05-05: qty 10, 5d supply, fill #0
  Filled 2024-05-05: qty 10, 3d supply, fill #0

## 2024-05-05 MED ORDER — SULFAMETHOXAZOLE-TRIMETHOPRIM 400-80 MG PO TABS
1.0000 | ORAL_TABLET | Freq: Two times a day (BID) | ORAL | 0 refills | Status: AC
  Filled 2024-05-05 (×2): qty 10, 5d supply, fill #0

## 2024-05-08 ENCOUNTER — Encounter (HOSPITAL_BASED_OUTPATIENT_CLINIC_OR_DEPARTMENT_OTHER): Payer: PRIVATE HEALTH INSURANCE | Admitting: Radiology

## 2024-05-08 DIAGNOSIS — Z1231 Encounter for screening mammogram for malignant neoplasm of breast: Secondary | ICD-10-CM

## 2024-05-10 ENCOUNTER — Other Ambulatory Visit: Payer: Self-pay

## 2024-05-10 ENCOUNTER — Other Ambulatory Visit (HOSPITAL_COMMUNITY): Payer: Self-pay

## 2024-05-10 MED ORDER — AMOXICILLIN 875 MG PO TABS
875.0000 mg | ORAL_TABLET | Freq: Two times a day (BID) | ORAL | 0 refills | Status: AC
  Filled 2024-05-10 (×2): qty 14, 7d supply, fill #0

## 2024-05-12 DIAGNOSIS — K5900 Constipation, unspecified: Secondary | ICD-10-CM | POA: Diagnosis not present

## 2024-05-12 DIAGNOSIS — N200 Calculus of kidney: Secondary | ICD-10-CM | POA: Diagnosis not present

## 2024-05-12 DIAGNOSIS — N281 Cyst of kidney, acquired: Secondary | ICD-10-CM | POA: Diagnosis not present

## 2024-05-12 DIAGNOSIS — K7689 Other specified diseases of liver: Secondary | ICD-10-CM | POA: Diagnosis not present
